# Patient Record
Sex: Female | Born: 1990 | Race: White | Hispanic: No | Marital: Married | Smoking: Former smoker
Health system: Southern US, Community
[De-identification: ages and names within clinical notes are randomized; demographics above are authoritative.]

## PROBLEM LIST (undated history)

## (undated) DIAGNOSIS — G43909 Migraine, unspecified, not intractable, without status migrainosus: Secondary | ICD-10-CM

## (undated) DIAGNOSIS — Z87898 Personal history of other specified conditions: Secondary | ICD-10-CM

## (undated) DIAGNOSIS — F1911 Other psychoactive substance abuse, in remission: Secondary | ICD-10-CM

## (undated) DIAGNOSIS — R55 Syncope and collapse: Secondary | ICD-10-CM

## (undated) DIAGNOSIS — F1011 Alcohol abuse, in remission: Secondary | ICD-10-CM

## (undated) DIAGNOSIS — B1920 Unspecified viral hepatitis C without hepatic coma: Secondary | ICD-10-CM

## (undated) HISTORY — DX: Syncope and collapse: R55

## (undated) HISTORY — PX: BREAST SURGERY: SHX581

## (undated) HISTORY — DX: Other psychoactive substance abuse, in remission: F19.11

## (undated) HISTORY — DX: Migraine, unspecified, not intractable, without status migrainosus: G43.909

## (undated) HISTORY — DX: Personal history of other specified conditions: Z87.898

## (undated) HISTORY — DX: Alcohol abuse, in remission: F10.11

## (undated) HISTORY — PX: TONSILLECTOMY: SUR1361

## (undated) HISTORY — PX: BREAST MASS EXCISION: SHX1267

---

## 2017-10-22 NOTE — L&D Delivery Note (Signed)
Delivery Note At 7:39 PM a viable female was delivered via Vaginal, Spontaneous (Presentation: OA to LOT ).  APGAR: 9, 9; weight  pending.   Placenta status: S/C/I ,Tomasa BlaseSchultz, for disposal in unit.  Cord: 3VC with the following complications: none.  Cord blood collected for typing  Anesthesia:  Epidural, local lido Episiotomy: None Lacerations:  2nd degree Suture Repair: 2.0 vicryl Est. Blood Loss (mL):  200  Mom to postpartum.  Baby "Kelsey Craig" to Couplet care / Skin to Skin.  Neta MendsDaniela C Paul, CNM 03/29/2018, 8:16 PM

## 2018-02-06 LAB — OB RESULTS CONSOLE RUBELLA ANTIBODY, IGM: Rubella: IMMUNE

## 2018-02-06 LAB — OB RESULTS CONSOLE HIV ANTIBODY (ROUTINE TESTING): HIV: NONREACTIVE

## 2018-02-06 LAB — OB RESULTS CONSOLE GC/CHLAMYDIA
CHLAMYDIA, DNA PROBE: NEGATIVE
GC PROBE AMP, GENITAL: NEGATIVE

## 2018-02-06 LAB — OB RESULTS CONSOLE VARICELLA ZOSTER ANTIBODY, IGG: Varicella: IMMUNE

## 2018-02-06 LAB — OB RESULTS CONSOLE ABO/RH: RH TYPE: POSITIVE

## 2018-02-06 LAB — OB RESULTS CONSOLE HEPATITIS B SURFACE ANTIGEN: Hepatitis B Surface Ag: NEGATIVE

## 2018-02-14 ENCOUNTER — Encounter (HOSPITAL_COMMUNITY): Payer: Self-pay

## 2018-02-14 ENCOUNTER — Ambulatory Visit (HOSPITAL_COMMUNITY): Payer: Self-pay

## 2018-02-18 ENCOUNTER — Inpatient Hospital Stay (HOSPITAL_COMMUNITY)
Admission: RE | Admit: 2018-02-18 | Discharge: 2018-02-18 | Disposition: A | Discharge status: Home / Self Care | Payer: Self-pay | Source: Ambulatory Visit | Attending: Obstetrics and Gynecology | Admitting: Obstetrics and Gynecology

## 2018-02-18 HISTORY — DX: Unspecified viral hepatitis C without hepatic coma: B19.20

## 2018-02-20 ENCOUNTER — Encounter (HOSPITAL_COMMUNITY): Payer: Self-pay

## 2018-02-20 ENCOUNTER — Ambulatory Visit (HOSPITAL_COMMUNITY)
Admission: RE | Admit: 2018-02-20 | Discharge: 2018-02-20 | Disposition: A | Discharge status: Home / Self Care | Payer: BLUE CROSS/BLUE SHIELD | Source: Ambulatory Visit | Attending: Obstetrics and Gynecology | Admitting: Obstetrics and Gynecology

## 2018-02-20 DIAGNOSIS — O98413 Viral hepatitis complicating pregnancy, third trimester: Secondary | ICD-10-CM | POA: Insufficient documentation

## 2018-02-20 DIAGNOSIS — Z3A35 35 weeks gestation of pregnancy: Secondary | ICD-10-CM | POA: Diagnosis not present

## 2018-02-20 DIAGNOSIS — B182 Chronic viral hepatitis C: Secondary | ICD-10-CM | POA: Insufficient documentation

## 2018-02-20 DIAGNOSIS — O98419 Viral hepatitis complicating pregnancy, unspecified trimester: Secondary | ICD-10-CM

## 2018-02-20 NOTE — Consult Note (Signed)
Maternal Fetal Medicine Consultation  Requesting Provider(s): [facility]  Primary OB: Sigmon Reason for consultation: Chronic Hepatitis C; delivery management recommendations  HPI: 27yo P0 at 35+3 weeks with chronic Hepatitis C, referred for delivery recommendations. 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 She recently saw the Hepatology clinic at [facility] for evaluation. At that time LFTs were normal, and she has been set up for repeat visit in 6 months to undergo treatment with antivirals. She does not know if she has had a recent viral load. She is reporting no problems with the pregnancy so far. OB History: OB History    Gravida  1   Para      Term      Preterm      AB      Living        SAB      TAB      Ectopic      Multiple      Live Births              PMH:  Past Medical History:  Diagnosis Date  . Hepatitis C     PSH:  Past Surgical History:  Procedure Laterality Date  . BREAST MASS EXCISION     Meds: PNV Allergies: Latex FH: See EPIC section Soc: See EPIC section  Review of Systems: no vaginal bleeding or cramping/contractions, no LOF, no nausea/vomiting. All other systems reviewed and are negative.  PE:  VS: BP 127/85  Pulse 102  Weight 204.2 lb GEN: well-appearing female ABD: gravid, NT  A/P: Chronic hepatitis C As far as delivery planning is concerned there is no need to interven prior to 40 weeks. Elective delivery after 39 weeks is acceptable. Factors associated with an increased risk of vertical Hepatitis C transmission include High viral load Prolonged ROM (>6 hours) Infection of mononuclear cells in the peripheral blood Invasive obstetrical procedures (episiotomy, fetal scalp electrodes). Factors that are NOT associated with higher rates of vertical transmission include Vaginal versus cesarean delivery Maternal IL28B genotype HCV genotype In short, delivery at term with  cesarean section for obstetrical indications only is the recommended interventions for patient's with chronic hepatitis C. Avoidance of SROM and prolonged ROM is recommended. Avoidance of any procedures that increase exposure of the baby to maternal blood are recommended Breastfeeding is not contraindicated unless the nipples are cracked or bleeding, or if the patient desires to begin antiviral treatment.  Thank you for the opportunity to be a part of the care of [facility]. Please contact our office if we can be of further assistance.   I spent approximately 30 minutes with this patient with over 50% of time spent in face-to-face counseling.

## 2018-03-03 LAB — OB RESULTS CONSOLE GBS: GBS: NEGATIVE

## 2018-03-29 ENCOUNTER — Encounter (HOSPITAL_COMMUNITY): Payer: Self-pay | Admitting: *Deleted

## 2018-03-29 ENCOUNTER — Inpatient Hospital Stay (HOSPITAL_COMMUNITY)
Admission: AD | Admit: 2018-03-29 | Discharge: 2018-03-31 | DRG: 806 | Disposition: A | Discharge status: Home / Self Care | Payer: BLUE CROSS/BLUE SHIELD | Attending: Obstetrics & Gynecology | Admitting: Obstetrics & Gynecology

## 2018-03-29 ENCOUNTER — Inpatient Hospital Stay (HOSPITAL_COMMUNITY): Payer: BLUE CROSS/BLUE SHIELD | Admitting: Anesthesiology

## 2018-03-29 ENCOUNTER — Encounter (HOSPITAL_COMMUNITY): Payer: Self-pay

## 2018-03-29 DIAGNOSIS — B182 Chronic viral hepatitis C: Secondary | ICD-10-CM | POA: Diagnosis present

## 2018-03-29 DIAGNOSIS — O26893 Other specified pregnancy related conditions, third trimester: Secondary | ICD-10-CM | POA: Diagnosis present

## 2018-03-29 DIAGNOSIS — R030 Elevated blood-pressure reading, without diagnosis of hypertension: Secondary | ICD-10-CM | POA: Diagnosis present

## 2018-03-29 DIAGNOSIS — Z3A40 40 weeks gestation of pregnancy: Secondary | ICD-10-CM | POA: Diagnosis not present

## 2018-03-29 DIAGNOSIS — O9081 Anemia of the puerperium: Secondary | ICD-10-CM | POA: Diagnosis present

## 2018-03-29 DIAGNOSIS — D62 Acute posthemorrhagic anemia: Secondary | ICD-10-CM | POA: Diagnosis present

## 2018-03-29 DIAGNOSIS — O9842 Viral hepatitis complicating childbirth: Secondary | ICD-10-CM | POA: Diagnosis present

## 2018-03-29 DIAGNOSIS — O169 Unspecified maternal hypertension, unspecified trimester: Secondary | ICD-10-CM | POA: Diagnosis present

## 2018-03-29 DIAGNOSIS — O98419 Viral hepatitis complicating pregnancy, unspecified trimester: Secondary | ICD-10-CM | POA: Diagnosis present

## 2018-03-29 DIAGNOSIS — Z3483 Encounter for supervision of other normal pregnancy, third trimester: Secondary | ICD-10-CM | POA: Diagnosis present

## 2018-03-29 LAB — CBC
HCT: 33.9 % — ABNORMAL LOW (ref 36.0–46.0)
HCT: 35.4 % — ABNORMAL LOW (ref 36.0–46.0)
Hemoglobin: 11.7 g/dL — ABNORMAL LOW (ref 12.0–15.0)
Hemoglobin: 12.1 g/dL (ref 12.0–15.0)
MCH: 29.8 pg (ref 26.0–34.0)
MCH: 29.7 pg (ref 26.0–34.0)
MCHC: 34.5 g/dL (ref 30.0–36.0)
MCHC: 34.2 g/dL (ref 30.0–36.0)
MCV: 86.3 fL (ref 78.0–100.0)
MCV: 86.8 fL (ref 78.0–100.0)
PLATELETS: 214 10*3/uL (ref 150–400)
Platelets: 231 10*3/uL (ref 150–400)
RBC: 3.93 MIL/uL (ref 3.87–5.11)
RBC: 4.08 MIL/uL (ref 3.87–5.11)
RDW: 14.1 % (ref 11.5–15.5)
RDW: 14.2 % (ref 11.5–15.5)
WBC: 15 10*3/uL — ABNORMAL HIGH (ref 4.0–10.5)
WBC: 16.9 10*3/uL — ABNORMAL HIGH (ref 4.0–10.5)

## 2018-03-29 LAB — COMPREHENSIVE METABOLIC PANEL
ALT: 24 U/L (ref 14–54)
ANION GAP: 11 (ref 5–15)
AST: 25 U/L (ref 15–41)
Albumin: 3 g/dL — ABNORMAL LOW (ref 3.5–5.0)
Alkaline Phosphatase: 179 U/L — ABNORMAL HIGH (ref 38–126)
BILIRUBIN TOTAL: 1.2 mg/dL (ref 0.3–1.2)
BUN: 7 mg/dL (ref 6–20)
CALCIUM: 9.1 mg/dL (ref 8.9–10.3)
CO2: 19 mmol/L — ABNORMAL LOW (ref 22–32)
Chloride: 106 mmol/L (ref 101–111)
Creatinine, Ser: 0.53 mg/dL (ref 0.44–1.00)
GFR calc Af Amer: >60 mL/min (ref >60)
Glucose, Bld: 84 mg/dL (ref 65–99)
POTASSIUM: 3.9 mmol/L (ref 3.5–5.1)
Sodium: 136 mmol/L (ref 135–145)
TOTAL PROTEIN: 6.7 g/dL (ref 6.5–8.1)

## 2018-03-29 LAB — TYPE AND SCREEN
ABO/RH(D): A POS
Antibody Screen: NEGATIVE

## 2018-03-29 LAB — ABO/RH: ABO/RH(D): A POS

## 2018-03-29 LAB — URIC ACID: Uric Acid, Serum: 4.4 mg/dL (ref 2.3–6.6)

## 2018-03-29 MED ORDER — LACTATED RINGERS IV BOLUS
1000.0000 mL | Freq: Once | INTRAVENOUS | Status: AC
  Administered 2018-03-29: 1000 mL via INTRAVENOUS

## 2018-03-29 MED ORDER — ONDANSETRON HCL 4 MG/2ML IJ SOLN: 4.0000 mg | INTRAMUSCULAR | Status: DC | PRN

## 2018-03-29 MED ORDER — DIBUCAINE 1 % RE OINT
1.0000 "application " | TOPICAL_OINTMENT | RECTAL | Status: DC | PRN
  Administered 2018-03-30: 1 via RECTAL
  Filled 2018-03-29: qty 28

## 2018-03-29 MED ORDER — FENTANYL 2.5 MCG/ML BUPIVACAINE 1/10 % EPIDURAL INFUSION (WH - ANES)
14.0000 mL/h | INTRAMUSCULAR | Status: DC | PRN
  Administered 2018-03-29: 14 mL/h via EPIDURAL
  Filled 2018-03-29: qty 100

## 2018-03-29 MED ORDER — SIMETHICONE 80 MG PO CHEW: 80.0000 mg | CHEWABLE_TABLET | ORAL | Status: DC | PRN

## 2018-03-29 MED ORDER — DIPHENHYDRAMINE HCL 25 MG PO CAPS: 25.0000 mg | ORAL_CAPSULE | Freq: Four times a day (QID) | ORAL | Status: DC | PRN

## 2018-03-29 MED ORDER — LIDOCAINE HCL (PF) 1 % IJ SOLN
30.0000 mL | INTRAMUSCULAR | Status: AC | PRN
  Administered 2018-03-29: 30 mL via SUBCUTANEOUS
  Filled 2018-03-29: qty 30

## 2018-03-29 MED ORDER — EPHEDRINE 5 MG/ML INJ
10.0000 mg | INTRAVENOUS | Status: DC | PRN
  Filled 2018-03-29: qty 2

## 2018-03-29 MED ORDER — IBUPROFEN 600 MG PO TABS
600.0000 mg | ORAL_TABLET | Freq: Four times a day (QID) | ORAL | Status: DC
  Administered 2018-03-29 – 2018-03-31 (×7): 600 mg via ORAL
  Filled 2018-03-29 (×7): qty 1

## 2018-03-29 MED ORDER — COCONUT OIL OIL: 1.0000 "application " | TOPICAL_OIL | Status: DC | PRN

## 2018-03-29 MED ORDER — ONDANSETRON HCL 4 MG PO TABS: 4.0000 mg | ORAL_TABLET | ORAL | Status: DC | PRN

## 2018-03-29 MED ORDER — LACTATED RINGERS IV SOLN
INTRAVENOUS | Status: DC
  Administered 2018-03-29: 16:00:00 via INTRAVENOUS

## 2018-03-29 MED ORDER — BENZOCAINE-MENTHOL 20-0.5 % EX AERO
1.0000 "application " | INHALATION_SPRAY | CUTANEOUS | Status: DC | PRN
  Filled 2018-03-29: qty 56

## 2018-03-29 MED ORDER — TERBUTALINE SULFATE 1 MG/ML IJ SOLN
0.2500 mg | Freq: Once | INTRAMUSCULAR | Status: DC | PRN
  Filled 2018-03-29: qty 1

## 2018-03-29 MED ORDER — LACTATED RINGERS IV SOLN: 500.0000 mL | INTRAVENOUS | Status: DC | PRN

## 2018-03-29 MED ORDER — ONDANSETRON HCL 4 MG/2ML IJ SOLN: 4.0000 mg | Freq: Four times a day (QID) | INTRAMUSCULAR | Status: DC | PRN

## 2018-03-29 MED ORDER — FLEET ENEMA 7-19 GM/118ML RE ENEM: 1.0000 | ENEMA | RECTAL | Status: DC | PRN

## 2018-03-29 MED ORDER — BISACODYL 10 MG RE SUPP: 10.0000 mg | Freq: Every day | RECTAL | Status: DC | PRN

## 2018-03-29 MED ORDER — ACETAMINOPHEN 325 MG PO TABS
650.0000 mg | ORAL_TABLET | ORAL | Status: DC | PRN
  Administered 2018-03-29: 650 mg via ORAL
  Filled 2018-03-29: qty 2

## 2018-03-29 MED ORDER — ACETAMINOPHEN 325 MG PO TABS
650.0000 mg | ORAL_TABLET | ORAL | Status: DC | PRN
  Administered 2018-03-31: 650 mg via ORAL
  Filled 2018-03-29: qty 2

## 2018-03-29 MED ORDER — OXYTOCIN 40 UNITS IN LACTATED RINGERS INFUSION - SIMPLE MED
2.5000 [IU]/h | INTRAVENOUS | Status: DC
  Administered 2018-03-29: 2.5 [IU]/h via INTRAVENOUS

## 2018-03-29 MED ORDER — OXYTOCIN 40 UNITS IN LACTATED RINGERS INFUSION - SIMPLE MED
1.0000 m[IU]/min | INTRAVENOUS | Status: DC
  Administered 2018-03-29: 2 m[IU]/min via INTRAVENOUS
  Filled 2018-03-29: qty 1000

## 2018-03-29 MED ORDER — OXYCODONE-ACETAMINOPHEN 5-325 MG PO TABS: 1.0000 | ORAL_TABLET | ORAL | Status: DC | PRN

## 2018-03-29 MED ORDER — OXYTOCIN BOLUS FROM INFUSION
500.0000 mL | Freq: Once | INTRAVENOUS | Status: AC
  Administered 2018-03-29: 500 mL via INTRAVENOUS

## 2018-03-29 MED ORDER — OXYCODONE HCL 5 MG PO TABS: 5.0000 mg | ORAL_TABLET | ORAL | Status: DC | PRN

## 2018-03-29 MED ORDER — DOCUSATE SODIUM 100 MG PO CAPS
100.0000 mg | ORAL_CAPSULE | Freq: Two times a day (BID) | ORAL | Status: DC
  Administered 2018-03-30 – 2018-03-31 (×3): 100 mg via ORAL
  Filled 2018-03-29 (×3): qty 1

## 2018-03-29 MED ORDER — TETANUS-DIPHTH-ACELL PERTUSSIS 5-2.5-18.5 LF-MCG/0.5 IM SUSP: 0.5000 mL | Freq: Once | INTRAMUSCULAR | Status: DC

## 2018-03-29 MED ORDER — OXYCODONE-ACETAMINOPHEN 5-325 MG PO TABS: 2.0000 | ORAL_TABLET | ORAL | Status: DC | PRN

## 2018-03-29 MED ORDER — SOD CITRATE-CITRIC ACID 500-334 MG/5ML PO SOLN: 30.0000 mL | ORAL | Status: DC | PRN

## 2018-03-29 MED ORDER — PROMETHAZINE HCL 25 MG/ML IJ SOLN
25.0000 mg | Freq: Once | INTRAMUSCULAR | Status: AC
  Administered 2018-03-29: 25 mg via INTRAVENOUS
  Filled 2018-03-29: qty 1

## 2018-03-29 MED ORDER — PRENATAL MULTIVITAMIN CH
1.0000 | ORAL_TABLET | Freq: Every day | ORAL | Status: DC
  Administered 2018-03-30 – 2018-03-31 (×2): 1 via ORAL
  Filled 2018-03-29 (×2): qty 1

## 2018-03-29 MED ORDER — FLEET ENEMA 7-19 GM/118ML RE ENEM: 1.0000 | ENEMA | Freq: Every day | RECTAL | Status: DC | PRN

## 2018-03-29 MED ORDER — PHENYLEPHRINE 40 MCG/ML (10ML) SYRINGE FOR IV PUSH (FOR BLOOD PRESSURE SUPPORT)
80.0000 ug | PREFILLED_SYRINGE | INTRAVENOUS | Status: DC | PRN
  Filled 2018-03-29: qty 5

## 2018-03-29 MED ORDER — NALBUPHINE HCL 10 MG/ML IJ SOLN: 10.0000 mg | Freq: Once | INTRAMUSCULAR | Status: DC | PRN

## 2018-03-29 MED ORDER — WITCH HAZEL-GLYCERIN EX PADS
1.0000 "application " | MEDICATED_PAD | CUTANEOUS | Status: DC | PRN
  Administered 2018-03-30: 1 via TOPICAL

## 2018-03-29 MED ORDER — BUTORPHANOL TARTRATE 1 MG/ML IJ SOLN
2.0000 mg | Freq: Once | INTRAMUSCULAR | Status: AC
  Administered 2018-03-29: 2 mg via INTRAVENOUS
  Filled 2018-03-29: qty 2

## 2018-03-29 MED ORDER — DIPHENHYDRAMINE HCL 50 MG/ML IJ SOLN: 12.5000 mg | INTRAMUSCULAR | Status: DC | PRN

## 2018-03-29 MED ORDER — PHENYLEPHRINE 40 MCG/ML (10ML) SYRINGE FOR IV PUSH (FOR BLOOD PRESSURE SUPPORT)
80.0000 ug | PREFILLED_SYRINGE | INTRAVENOUS | Status: DC | PRN
  Filled 2018-03-29: qty 5
  Filled 2018-03-29: qty 10

## 2018-03-29 MED ORDER — LACTATED RINGERS IV SOLN
500.0000 mL | Freq: Once | INTRAVENOUS | Status: AC
  Administered 2018-03-29: 500 mL via INTRAVENOUS

## 2018-03-29 MED ORDER — LIDOCAINE HCL (PF) 1 % IJ SOLN
INTRAMUSCULAR | Status: DC | PRN
  Administered 2018-03-29: 6 mL via EPIDURAL
  Administered 2018-03-29: 7 mL via EPIDURAL

## 2018-03-29 MED ORDER — FLUTICASONE PROPIONATE 50 MCG/ACT NA SUSP
1.0000 | Freq: Every day | NASAL | Status: DC | PRN
  Filled 2018-03-29: qty 16

## 2018-03-29 NOTE — Anesthesia Procedure Notes (Addendum)
Epidural Patient location during procedure: OB Start time: 03/29/2018 4:58 PM End time: 03/29/2018 5:02 PM  Staffing Anesthesiologist: Leilani AbleHatchett, Karianna Gusman, MD Performed: anesthesiologist   Preanesthetic Checklist Completed: patient identified, site marked, surgical consent, pre-op evaluation, timeout performed, IV checked, risks and benefits discussed and monitors and equipment checked  Epidural Patient position: sitting Prep: site prepped and draped and DuraPrep Patient monitoring: continuous pulse ox and blood pressure Approach: midline Location: L3-L4 Injection technique: LOR air  Needle:  Needle type: Tuohy  Needle gauge: 17 G Needle length: 9 cm and 9 Needle insertion depth: 6 cm Catheter type: closed end flexible Catheter size: 19 Gauge Catheter at skin depth: 11 cm Test dose: negative and Other  Assessment Sensory level: T9 Events: blood not aspirated, injection not painful, no injection resistance, negative IV test and paresthesia  Additional Notes R knee X 1Reason for block:procedure for pain

## 2018-03-29 NOTE — Progress Notes (Signed)
CNM gives pt options for POC at this time including going home, IV fluids and pain meds to rest or augmentation with pitocin, pt would like to try pain meds and rest at this time.

## 2018-03-29 NOTE — Progress Notes (Signed)
S: Comfortable post epidural.   O:  Vitals:   03/29/18 1452 03/29/18 1557  BP: 106/69 132/83  Pulse: 84 (!) 101  Resp: 16   Temp:     FHR 150, mod var, + accels, + early decels Ctx q 2-3 min, palp mod Dilation: 5 Effacement (%): 90 Station: 0 Presentation: Vertex Exam by:: D. Angeleah Labrake,CNM  Pitocin 2 miu/min  A/P: Active labor Pitocin augmentation FHR cat 1 Pain control - epidural  Anticipate NSVB.   Kelsey Mendsaniela C Lolamae Voisin, MSN, CNM 03/29/2018, 5:18 PM

## 2018-03-29 NOTE — Progress Notes (Signed)
Carlyon ShadowMary Elizabeth Bogusz is a 27 y.o. G1P0 at 1158w5d by LMP admitted for latent labor, Hep C chronic  Subjective:  Rested w/ Stadol for several hours, now awake and feeling ctx stronger. LOF noted with bathroom trip.   Objective: Vitals:   03/29/18 0827 03/29/18 0931 03/29/18 1255 03/29/18 1452  BP: 120/67 (!) 144/93 115/65 106/69  Pulse: 79 100 69 84  Resp: 16 16 16 16   Temp:   98.3 F (36.8 C)   TempSrc:   Oral   Weight:      Height:           FHT:  FHR: 140 bpm, variability: moderate,  accelerations:  Present,  decelerations:  Present early UC:   irregular, every 2-6  minutes SVE:   Dilation: 4 Effacement (%): 90 Station: 0 Exam by:: D.Paul,CNM  Labs:   Recent Labs    03/29/18 0245  WBC 15.0*  HGB 11.7*  HCT 33.9*  PLT 214   CMP     Component Value Date/Time   NA 136 03/29/2018 1007   K 3.9 03/29/2018 1007   CL 106 03/29/2018 1007   CO2 19 (L) 03/29/2018 1007   GLUCOSE 84 03/29/2018 1007   BUN 7 03/29/2018 1007   CREATININE 0.53 03/29/2018 1007   CALCIUM 9.1 03/29/2018 1007   PROT 6.7 03/29/2018 1007   ALBUMIN 3.0 (L) 03/29/2018 1007   AST 25 03/29/2018 1007   ALT 24 03/29/2018 1007   ALKPHOS 179 (H) 03/29/2018 1007   BILITOT 1.2 03/29/2018 1007   GFRNONAA >60 03/29/2018 1007   GFRAA >60 03/29/2018 1007   Assessment / Plan: Protracted latent phase  Labor: Start Pitocin augmentation Preeclampsia:  no signs or symptoms of toxicity and labs stable Fetal Wellbeing:  Category I Pain Control:  nitrous, aepidural, IV narcotics and labor support as needed I/D:  GBS neg, HCV viral load pending, avoid internal monitors, expedite delivery after SROM Anticipated MOD:  NSVD  Neta Mendsaniela C Paul, CNM, MSN 03/29/2018, 3:34 PM

## 2018-03-29 NOTE — MAU Note (Signed)
Membranes stripped on Thurs and 2cm at the time. Having strong ctxs for last 2 hours and bloody show.

## 2018-03-29 NOTE — Anesthesia Pain Management Evaluation Note (Signed)
  CRNA Pain Management Visit Note  Patient: Kelsey ShadowMary Elizabeth Brinker, 27 y.o., female  "Hello I am a member of the anesthesia team at [facility]. We have an anesthesia team available at all times to provide care throughout the hospital, including epidural management and anesthesia for C-section. I don't know your plan for the delivery whether it a natural birth, water birth, IV sedation, nitrous supplementation, doula or epidural, but we want to meet your pain goals."   1.Was your pain managed to your expectations on prior hospitalizations?   No prior hospitalizations  2.What is your expectation for pain management during this hospitalization?     Doula  3.How can we help you reach that goal? Patient has doula  Record the patient's initial score and the patient's pain goal.   Pain: 0  Pain Goal: 6 [facility] wants you to be able to say your pain was always managed very well.  Aniket Paye 03/29/2018

## 2018-03-29 NOTE — H&P (Signed)
OB ADMISSION/ HISTORY & PHYSICAL:  Admission Date: 03/29/2018  1:18 AM  Admit Diagnosis: Term pregnancy in latent labor.  Kelsey Craig is a 27 y.o. female presenting for painful ctx for past 2 days since membrane sweep done in office on 6/6. Reports ctx more intense since midnight and some bloody show noted. No LOF, + FM.  Has doula support and spouse Dorene SorrowJerry present and supportive.   Prenatal History: G1P0   EDC : 03/24/2018, by Last Menstrual Period  [facility] since [redacted] weeks gestation, [facility].  Prenatal course complicated by chronic Hep C (genotipe 3) s/p MFM and GI specialist consult Hx intravenous substance abuse and alcoholism, sober x 3 years  Prenatal Labs: ABO, Rh: A POS (04/18 0000)  Antibody: NEG (06/08 0245) Rubella: Immune (04/18 0000)  RPR:  non-reactive  HBsAg: Negative (04/18 0000)  HIV: Non-reactive (04/18 0000)  GBS: Negative (05/13 0000)  1 hr Glucola : 78 Genetics declined   Medical / Surgical History :  Past medical history:  Past Medical History:  Diagnosis Date  . Hepatitis C      Past surgical history:  Past Surgical History:  Procedure Laterality Date  . BREAST MASS EXCISION       Family History: History reviewed. No pertinent family history.   Social History:  reports that she has never smoked. She has never used smokeless tobacco. She reports that she drank alcohol. She reports that she has current or past drug history.   Allergies: Latex    Current Medications at time of admission:  Medications Prior to Admission  Medication Sig Dispense Refill Last Dose  . acetaminophen (TYLENOL) 325 MG tablet Take 650 mg by mouth every 6 (six) hours as needed for mild pain or headache.    Past Week at Unknown time  . fluticasone (FLONASE) 50 MCG/ACT nasal spray Place 1 spray into both nostrils daily as needed for allergies or rhinitis.   Past Week at Unknown time  . Prenatal Vit-Fe  Fumarate-FA (PRENATAL VITAMIN PO) Take 1 tablet by mouth daily.    03/28/2018 at Unknown time      Review of Systems: ROS  As noted above Denies HA,NV/RUQ pain/visual changes   Physical Exam:  Dilation: 3 Effacement (%): 90 Station: -1 Exam by:: D Angle Dirusso,CNM  Blood pressure 137/82, pulse 94, temperature 98.3 F (36.8 C), temperature source Oral, resp. rate 16, height 5\' 6"  (1.676 m), weight 104.8 kg (231 lb), last menstrual period 06/17/2017. BP range 137/82 - 141/90  General: AAO x 3, NAD, breathing w/ ctx, tired Heart: RRR Lungs:CTAB Abdomen:gravid, NT Extremities: no edema Genitalia / VE: no lesions, minimal show FHR: 140, mod var, + accels, no decels TOCO: q 3-4 min, palp, mild/mod  Labs:    Recent Labs    03/29/18 0245  WBC 15.0*  HGB 11.7*  HCT 33.9*  PLT 214       Assessment:  27 y.o. G1P0 at 4937w5d  1. Latent stage of labor 2. FHR category 1 3. GBS neg 4. Desires low intervention birth 5. Breastfeeding planned 6. Hep C carrier 7. Labile BP w/o neural s/sx  Plan:  1. Admit to BS 2. Routine L&D orders and PEC, HCV RNA load 3. Therapeutic rest - Stadol/Phenergan IV, LR 1L bolus 4. Expectant management for now, plan Pitocin augmentation if active physiologic labor not ensued during rest 5. Avoid AROM, internal monitoring 6. Anticipate NSVB   Dr Juliene PinaMody notified of  admission / plan of care   Neta Mends CNM, MSN 03/29/2018, 10:01 AM

## 2018-03-29 NOTE — Anesthesia Preprocedure Evaluation (Signed)
Anesthesia Evaluation  Patient identified by MRN, date of birth, ID band Patient awake    Reviewed: Allergy & Precautions, H&P , NPO status , Patient's Chart, lab work & pertinent test results  Airway Mallampati: II  TM Distance: >3 FB Neck ROM: full    Dental no notable dental hx. (+) Teeth Intact   Pulmonary neg pulmonary ROS,    Pulmonary exam normal breath sounds clear to auscultation       Cardiovascular negative cardio ROS Normal cardiovascular exam Rhythm:regular Rate:Normal     Neuro/Psych negative neurological ROS  negative psych ROS   GI/Hepatic negative GI ROS, (+) Hepatitis -, C  Endo/Other  negative endocrine ROS  Renal/GU negative Renal ROS  negative genitourinary   Musculoskeletal negative musculoskeletal ROS (+)   Abdominal (+) + obese,   Peds  Hematology negative hematology ROS (+)   Anesthesia Other Findings   Reproductive/Obstetrics (+) Pregnancy                             Anesthesia Physical Anesthesia Plan  ASA: II  Anesthesia Plan: Epidural   Post-op Pain Management:    Induction:   PONV Risk Score and Plan:   Airway Management Planned:   Additional Equipment:   Intra-op Plan:   Post-operative Plan:   Informed Consent: I have reviewed the patients History and Physical, chart, labs and discussed the procedure including the risks, benefits and alternatives for the proposed anesthesia with the patient or authorized representative who has indicated his/her understanding and acceptance.     Plan Discussed with:   Anesthesia Plan Comments:         Anesthesia Quick Evaluation

## 2018-03-30 ENCOUNTER — Other Ambulatory Visit: Payer: Self-pay

## 2018-03-30 LAB — CBC
HCT: 31 % — ABNORMAL LOW (ref 36.0–46.0)
Hemoglobin: 10.4 g/dL — ABNORMAL LOW (ref 12.0–15.0)
MCH: 29.9 pg (ref 26.0–34.0)
MCHC: 33.5 g/dL (ref 30.0–36.0)
MCV: 89.1 fL (ref 78.0–100.0)
Platelets: 225 10*3/uL (ref 150–400)
RBC: 3.48 MIL/uL — AB (ref 3.87–5.11)
RDW: 14.6 % (ref 11.5–15.5)
WBC: 15.5 10*3/uL — ABNORMAL HIGH (ref 4.0–10.5)

## 2018-03-30 LAB — HCV RNA QUANT
HCV QUANT LOG: 6.481 {Log_IU}/mL (ref >1.70)
HCV QUANT: 3030000 [IU]/mL (ref >50)

## 2018-03-30 LAB — RPR: RPR Ser Ql: NONREACTIVE

## 2018-03-30 NOTE — Progress Notes (Signed)
PPD # 1 SVD Information for the patient's newborn:  Kelsey Craig, Boy Kelsey Craig [[id number]][[id number]]  female      breast feeding  / Circumcision planned today Baby name: Kelsey Craig  S:  Reports feeling well, OOB to shower this AM.             Tolerating po/ No nausea or vomiting             Bleeding is light             Pain controlled with ibuprofen (OTC)             Up ad lib / ambulatory / voiding without difficulties        O:  A & O x 3, in no apparent distress              VS:  Vitals:   03/29/18 2201 03/29/18 2227 03/29/18 2340 03/30/18 0418  BP: 134/77 125/61 (!) 128/56 110/62  Pulse: 91 93 84 83  Resp: 18  18 17   Temp:  99.7 F (37.6 C) 98.9 F (37.2 C) 99.7 F (37.6 C)  TempSrc:  Oral Oral Oral  SpO2:   99%   Weight:      Height:        LABS:  Recent Labs    03/29/18 1626 03/30/18 0535  WBC 16.9* 15.5*  HGB 12.1 10.4*  HCT 35.4* 31.0*  PLT 231 225    Blood type: --/--/A POS, A POS Performed at [facility], [address] [place], [place] [address]  (06/08 0245)  Rubella: Immune (04/18 0000)   I&O: I/O last 3 completed shifts: In: -  Out: 350 [Urine:150; Blood:200]          No intake/output data recorded.  Lungs: Clear and unlabored  Heart: regular rate and rhythm / no murmurs  Abdomen: soft, non-tender, non-distended             Fundus: firm, non-tender, U-1  Perineum: repair intact  Lochia: moderate  Extremities: 1 edema, no calf pain or tenderness    A/P: PPD # 1 26 y.o., G1P1001   Principal Problem:   Second-degree perineal laceration, with delivery Active Problems:   Chronic hepatitis C affecting pregnancy, antepartum (HCC)   SVD 6/8   Elevated blood pressure affecting pregnancy, antepartum   Postpartum care following vaginal delivery  - BP stable PP, no s/sx PEC  - HCV viral load pending  - may breastfeed as long as not bleeding from nipples  - f/u w/ hepatologist at 6 month PP or when weaned off for initiating medical therapy   Doing well -  stable status  Routine post partum orders  Anticipate discharge tomorrow    Neta Mendsaniela C Benancio Osmundson, MSN, CNM 03/30/2018, 9:56 AM

## 2018-03-30 NOTE — Anesthesia Postprocedure Evaluation (Signed)
Anesthesia Post Note  Patient: Kelsey Craig  Procedure(s) Performed: AN AD HOC LABOR EPIDURAL     Patient location during evaluation: Mother Baby Anesthesia Type: Epidural Level of consciousness: awake and alert and oriented Pain management: satisfactory to patient Vital Signs Assessment: post-procedure vital signs reviewed and stable Respiratory status: spontaneous breathing and nonlabored ventilation Cardiovascular status: stable Postop Assessment: no headache, no backache, no signs of nausea or vomiting, adequate PO intake and patient able to bend at knees (patient up walking) Anesthetic complications: no    Last Vitals:  Vitals:   03/29/18 2340 03/30/18 0418  BP: (!) 128/56 110/62  Pulse: 84 83  Resp: 18 17  Temp: 37.2 C 37.6 C  SpO2: 99%     Last Pain:  Vitals:   03/30/18 0520  TempSrc:   PainSc: 0-No pain   Pain Goal: Patients Stated Pain Goal: 0 (03/29/18 2230)               Madison HickmanGREGORY,Aedon Deason

## 2018-03-30 NOTE — Lactation Note (Signed)
This note was copied from a baby's chart. Lactation Consultation Note  Patient Name: Kelsey Craig NWGNF'AToday's Date: 03/30/2018 Reason for consult: Initial assessment;Primapara;1st time breastfeeding;Term  P1 mother whose infant is now 615 hours old.  Mother concerned that baby has not fed since delivery.  I explained to parents that this is completely normal.  We reviewed feeding cues.  I encouraged STS while awake, feeding 8-12 times/24 hours or earlier if baby shows feeding cues and breast massage.  Explained normal newborn behavior.  Mother has large breasts with flat nipples.  Breast shells provided with instructions for use.  Mother stated that baby latched after delivery and she could feel a tug at her breast.  Offered to assist with a latch and mother accepted.  Positioned mother appropriately and assisted to latch baby onto the right breast in the football hold.  Baby did not open mouth and showed no interest in feeding at this time.  Reassured parents that this was normal.  He has a small emesis and reviewed how to handle spitting episodes and he also needed a diaper change.  After cleaning baby I showed parents how to swaddle and baby placed on back in bassinet.    Mom made aware of O/P services, breastfeeding support groups, community resources, and our phone # for post-discharge questions. Mother will call for assistance as needed and will begin wearing her breast shells later this morning.  Father present and supportive.   Maternal Data Formula Feeding for Exclusion: No Has patient been taught Hand Expression?: Yes Does the patient have breastfeeding experience prior to this delivery?: No  Feeding Feeding Type: Breast Fed Length of feed: 0 min  LATCH Score Latch: Too sleepy or reluctant, no latch achieved, no sucking elicited.  Audible Swallowing: None  Type of Nipple: Flat  Comfort (Breast/Nipple): Soft / non-tender  Hold (Positioning): Assistance needed to correctly position  infant at breast and maintain latch.  LATCH Score: 4  Interventions Interventions: Breast feeding basics reviewed;Assisted with latch;Skin to skin;Breast massage;Position options;Support pillows;Adjust position;Breast compression;Shells  Lactation Tools Discussed/Used     Consult Status Consult Status: Follow-up Date: 03/31/18 Follow-up type: In-patient    Kelsey Craig 03/30/2018, 1:12 AM

## 2018-03-31 MED ORDER — IBUPROFEN 600 MG PO TABS: 600.0000 mg | ORAL_TABLET | Freq: Four times a day (QID) | ORAL | 0 refills | Status: DC

## 2018-03-31 NOTE — Lactation Note (Signed)
This note was copied from a baby's chart. Lactation Consultation Note  Patient Name: Boy Elfredia NevinsMary Herren OZHYQ'MToday's Date: 03/31/2018 Reason for consult: Follow-up assessment   Baby 44 hours old.  Weight loss after circ and sleepiness yesterday.  Mother has small nipples and baby latches but at times but is concerned that he falls asleep quickly at the breast. Observed feeding w/ off and on latch.  Applied #16NS and baby sustained latch for more than 20 min.  Baby latched then on L breast without nipple shield and he sustained latch for more than 20 more min. A good amount of colostrum was in nipple shield after first breast. Overall infant's feedings have been longer than 30 min with intermittent swallows.  Set up DEBP with #21 flanges. Recommend when able mother post pump and give baby back volume pumped. FOB states mother is exhausted and really needs sleep. Mom encouraged to feed baby 8-12 times/24 hours and with feeding cues.  Suggest using nipple shield if needed.      Maternal Data Has patient been taught Hand Expression?: Yes Does the patient have breastfeeding experience prior to this delivery?: No  Feeding Feeding Type: Breast Fed Length of feed: 45 min  LATCH Score Latch: Grasps breast easily, tongue down, lips flanged, rhythmical sucking.  Audible Swallowing: A few with stimulation  Type of Nipple: Everted at rest and after stimulation  Comfort (Breast/Nipple): Soft / non-tender  Hold (Positioning): Assistance needed to correctly position infant at breast and maintain latch.  LATCH Score: 8  Interventions Interventions: DEBP;Breast feeding basics reviewed;Assisted with latch;Hand express;Breast compression;Adjust position;Support pillows;Position options  Lactation Tools Discussed/Used Tools: Nipple Shields Nipple shield size: 16   Consult Status Consult Status: Follow-up Date: 04/01/18 Follow-up type: In-patient    Dahlia ByesBerkelhammer, [facility] 03/31/2018, 4:26  PM

## 2018-03-31 NOTE — Progress Notes (Signed)
PPD #2. SVD with 2nd degree repair, [name]  S:  Reports feeling exhausted, but well overall              Tolerating po/ No nausea or vomiting / Denies dizziness or SOB             Bleeding is moderate             Pain controlled with Motrin and Tylenol             Up ad lib / ambulatory / voiding QS  Newborn breast feeding - baby had an x-ray today for bilious vomiting - normal results per FOB; pt. To see lactation prior to discharge / Circumcision - completed 6/9  O:               VS: BP 126/83 (BP Location: Right Arm)   Pulse 76   Temp 98.1 F (36.7 C) (Oral)   Resp 16   Ht '5\' 6"'  (1.676 m)   Wt 104.8 kg (231 lb)   LMP 06/17/2017   SpO2 99%   Breastfeeding? Unknown   BMI 37.28 kg/m    LABS:              Recent Labs    03/29/18 1626 03/30/18 0535  WBC 16.9* 15.5*  HGB 12.1 10.4*  PLT 231 225   Results for orders placed or performed during the hospital encounter of 03/29/18 (from the past 72 hour(s))  CBC     Status: Abnormal   Collection Time: 03/29/18  2:45 AM  Result Value Ref Range   WBC 15.0 (H) 4.0 - 10.5 K/uL   RBC 3.93 3.87 - 5.11 MIL/uL   Hemoglobin 11.7 (L) 12.0 - 15.0 g/dL   HCT 33.9 (L) 36.0 - 46.0 %   MCV 86.3 78.0 - 100.0 fL   MCH 29.8 26.0 - 34.0 pg   MCHC 34.5 30.0 - 36.0 g/dL   RDW 14.1 11.5 - 15.5 %   Platelets 214 150 - 400 K/uL    Comment: Performed at [facility], [address], [place], [place] [address]  RPR     Status: None   Collection Time: 03/29/18  2:45 AM  Result Value Ref Range   RPR Ser Ql Non Reactive Non Reactive    Comment: (NOTE) Performed At: [facility] [place], [place] [id number] Rush Farmer MD [id number] Performed at [facility], [address], [place], [place] [address]   Type and screen [place]     Status: None   Collection Time: 03/29/18  2:45 AM  Result Value Ref Range   ABO/RH(D) A POS    Antibody Screen NEG    Sample  Expiration      04/01/2018 Performed at [facility], [address], [place], [place] [address]   ABO/Rh     Status: None   Collection Time: 03/29/18  2:45 AM  Result Value Ref Range   ABO/RH(D)      A POS Performed at [facility], [address], [place], [place] [address]   HCV RNA quant     Status: None   Collection Time: 03/29/18 10:07 AM  Result Value Ref Range   HCV Quantitative 3,030,000 >50 IU/mL   HCV Quantitative Log 6.481 >1.70 log10 IU/mL   Test Information Comment     Comment: (NOTE) The quantitative range of this assay is 15 IU/mL to 100 million IU/mL. Performed At: [facility] [phone]  [place] [id number] Rush Farmer MD IO:[id number] Performed at [facility], [address], [place], [place] [address]   Comprehensive metabolic panel     Status: Abnormal   Collection Time: 03/29/18 10:07 AM  Result Value Ref Range   Sodium 136 135 - 145 mmol/L   Potassium 3.9 3.5 - 5.1 mmol/L   Chloride 106 101 - 111 mmol/L   CO2 19 (L) 22 - 32 mmol/L   Glucose, Bld 84 65 - 99 mg/dL   BUN 7 6 - 20 mg/dL   Creatinine, Ser 0.53 0.44 - 1.00 mg/dL   Calcium 9.1 8.9 - 10.3 mg/dL   Total Protein 6.7 6.5 - 8.1 g/dL   Albumin 3.0 (L) 3.5 - 5.0 g/dL   AST 25 15 - 41 U/L   ALT 24 14 - 54 U/L   Alkaline Phosphatase 179 (H) 38 - 126 U/L   Total Bilirubin 1.2 0.3 - 1.2 mg/dL   GFR calc non Af Amer >60 >60 mL/min   GFR calc Af Amer >60 >60 mL/min    Comment: (NOTE) The eGFR has been calculated using the CKD EPI equation. This calculation has not been validated in all clinical situations. eGFR's persistently <60 mL/min signify possible Chronic Kidney Disease.    Anion gap 11 5 - 15    Comment: Performed at [facility], [address], [place], [place] [address]  Uric acid     Status: None   Collection Time: 03/29/18 10:07 AM  Result Value Ref Range   Uric Acid, Serum 4.4 2.3 - 6.6 mg/dL    Comment: Performed at [facility], [address] [place], [place] [address]  CBC     Status: Abnormal   Collection Time: 03/29/18  4:26 PM  Result Value Ref Range   WBC 16.9 (H) 4.0 - 10.5 K/uL   RBC 4.08 3.87 - 5.11 MIL/uL   Hemoglobin 12.1 12.0 - 15.0 g/dL   HCT 35.4 (L) 36.0 - 46.0 %   MCV 86.8 78.0 - 100.0 fL   MCH 29.7 26.0 - 34.0 pg   MCHC 34.2 30.0 - 36.0 g/dL   RDW 14.2 11.5 - 15.5 %   Platelets 231 150 - 400 K/uL    Comment: Performed at [facility], [address], [place], [place] [address]  CBC     Status: Abnormal   Collection Time: 03/30/18  5:35 AM  Result Value Ref Range   WBC 15.5 (H) 4.0 - 10.5 K/uL   RBC 3.48 (L) 3.87 - 5.11 MIL/uL   Hemoglobin 10.4 (L) 12.0 - 15.0 g/dL   HCT 31.0 (L) 36.0 - 46.0 %   MCV 89.1 78.0 - 100.0 fL   MCH 29.9 26.0 - 34.0 pg   MCHC 33.5 30.0 - 36.0 g/dL   RDW 14.6 11.5 - 15.5 %   Platelets 225 150 - 400 K/uL    Comment: Performed at [facility], [address] [place], [place] [address]               Blood type: --/--/A POS, A POS Performed at [facility], [address], [place], [place] [address]  [id number]/08 0245)  Rubella: Immune (04/18 0000)                     I&O: Intake/Output      06/09 0701 - 06/10 0700 06/10 0701 - 06/11 0700   Urine (mL/kg/hr)     Blood     Total Output  Net                        Physical Exam:             Alert and oriented X3  Lungs: Clear and unlabored  Heart: regular rate and rhythm / no murmurs  Abdomen: soft, non-tender, non-distended              Fundus: firm, non-tender, U-2  Perineum: well approximated 2nd degree laceration, no edema, no ecchymosis, no erythema   Lochia: small, no clots   Extremities: trace BLE edema, no calf pain or tenderness    A/P: PPD # 2, SVD  2nd degree repair  Chronic Hepatitis C - viral load resulted   - F/u with hepatologist at 6 mo PP or when weaned from breastfeeding; has appt scheduled in October    - Okay to continue to breastfeeding as long as no bleeding from nipples    Mild ABL Anemia    - stable, asymptomatic   Elevated BP during labor   - delivered, BP stable, no s/s of PEC  Doing well - stable status  Routine post partum orders  Discharge home today   WOB discharge book, instructions, and warning s/s reviewed   F/u in 2 weeks, then at 6 weeks for PP visit    - Husband plans to go back to work after 1 week PP; he is gone during the week; they are planning to have a postpartum doula for extra support   Lars Pinks, MSN, CNM [facility]

## 2018-03-31 NOTE — Lactation Note (Signed)
This note was copied from a baby's chart. Lactation Consultation Note  Patient Name: Kelsey Craig: 03/31/2018 Reason for consult: Follow-up assessment   Observed end of feeding after 55 min. Baby has been able to latch successfully without nipple shield. Praised mother for efforts and patience.   Reviewed pump and finger syringe feeding.  Mother pumped and and after flanges had a good amount of colostrum with small amount in container. Suggest spoon feeding drops to baby when he wakes. Mother worried about weight loss last night and unsure and wants to supplement w/ formula.  FOB does not want to supplement. Reviewed LEAD and presented formula w/ volume guidelines. Suggest family breastfeed first before offering formula.   Mother will call nurse to help with finger syringe feeding if needed.    Maternal Data Has patient been taught Hand Expression?: Yes Does the patient have breastfeeding experience prior to this delivery?: No  Feeding Feeding Type: Breast Fed Length of feed: 55 min  LATCH Score Latch: Grasps breast easily, tongue down, lips flanged, rhythmical sucking.  Audible Swallowing: A few with stimulation  Type of Nipple: Everted at rest and after stimulation  Comfort (Breast/Nipple): Soft / non-tender  Hold (Positioning): Assistance needed to correctly position infant at breast and maintain latch.  LATCH Score: 8  Interventions Interventions: DEBP;Breast feeding basics reviewed;Assisted with latch;Hand express;Breast compression;Adjust position;Support pillows;Position options  Lactation Tools Discussed/Used Tools: Nipple Shields Nipple shield size: 16 Pump Review: Setup, frequency, and cleaning;Milk Storage Initiated by:: RB Craig initiated:: 03/31/18   Consult Status Consult Status: Follow-up Craig: 04/01/18 Follow-up type: In-patient    Dahlia ByesBerkelhammer, Unity [facility]pke [facility] 03/31/2018, 5:08 PM

## 2018-03-31 NOTE — Lactation Note (Signed)
This note was copied from a baby's chart. Lactation Consultation Note  Patient Name: Kelsey Craig HYQMV'HToday's Date: 03/31/2018   Baby 37 hours old.  Mother Hep C+ and is aware to pump and dump if nipples become cracked or bleeding and resume breastfeeding once healed. Provided mother w/ manual pump. Baby was circumcised yesterday.  She states she was fussy last night and cluster fed. Baby recently spit up large yellow/green emesis so will be having chest xray and MD requested waiting on latch. Had reviewed hand expression w/ mother and drops were expressed. Mother has small nipples so fitted her w/ #21 flanges for manual pump. LC will return later today to work w/ mother.      Maternal Data    Feeding Feeding Type: Breast Fed Length of feed: 35 min  LATCH Score                   Interventions    Lactation Tools Discussed/Used     Consult Status      Kelsey Craig, Kelsey Craig 03/31/2018, 9:16 AM

## 2018-03-31 NOTE — Discharge Summary (Signed)
Obstetric Discharge Summary   Patient Name: Kelsey Craig DOB: April 06, 1991 MRN: [id number]  Date of Admission: 03/29/2018 Date of Discharge: 03/31/2018 Date of Delivery: 03/29/18 Gestational Age at Delivery: 4413w5d  [facility] management/ M. Gearlene Godsil, CNM   Antepartum complications:  [facility] [redacted] weeks gestation, [facility]  Prenatal course complicated by chronic Hep C (genotipe 3) s/p MFM and GI specialist consult Hx intravenous substance abuse and alcoholism, sober x 3 years  Prenatal Labs:  ABO, Rh: A POS (04/18 0000)  Antibody: NEG (06/08 0245) Rubella: Immune (04/18 0000)  RPR:  non-reactive  HBsAg: Negative (04/18 0000)  HIV: Non-reactive (04/18 0000)  GBS: Negative (05/13 0000)  1 hr Glucola : 78 Genetics declined  Admitting Diagnosis: Labor at 40+5 weeks  Secondary Diagnoses: Patient Active Problem List   Diagnosis Date Noted  . SVD 6/8 03/29/2018  . Elevated blood pressure affecting pregnancy, antepartum 03/29/2018  . Postpartum care following vaginal delivery (6/8) 03/29/2018  . Second-degree perineal laceration, with delivery 03/29/2018  . Chronic hepatitis C affecting pregnancy, antepartum (HCC) 02/20/2018    Augmentation: Pitocin  Complications: None  Date of Delivery: 03/29/18 Delivered By: Arlan Organaniela Paul, CNM  Delivery Type: spontaneous vaginal delivery Anesthesia: epidural Placenta: sponatneous Laceration: 2nd degree lac Episiotomy: none  Newborn Data: Live born female  Birth Weight: 7 lb 7.9 oz (3400 g) APGAR: 9, 9  Newborn Delivery   Birth date/time:  03/29/2018 19:39:00 Delivery type:  Vaginal, Spontaneous     Hospital/Postpartum Course  (Vaginal Delivery): Pt. Admitted in early labor, slept with therapeutic rest, then progressed with SROM and Pitocin.  See notes for details.  Patient had an uncomplicated postpartum course.  By time of discharge on PPD#2,  her pain was controlled on oral pain medications; she had appropriate lochia and was ambulating, voiding without difficulty and tolerating regular diet.  She was deemed stable for discharge to home.     Labs: CBC Latest Ref Rng & Units 03/30/2018 03/29/2018 03/29/2018  WBC 4.0 - 10.5 K/uL 15.5(H) 16.9(H) 15.0(H)  Hemoglobin 12.0 - 15.0 g/dL 10.4(L) 12.1 11.7(L)  Hematocrit 36.0 - 46.0 % 31.0(L) 35.4(L) 33.9(L)  Platelets 150 - 400 K/uL 225 231 214   Conflict (See Lab Report): A POS/A POS Performed at [facility], [address] [place], [place] [address]   Physical exam:  BP 126/83 (BP Location: Right Arm)   Pulse 76   Temp 98.1 F (36.7 C) (Oral)   Resp 16   Ht 5\' 6"  (1.676 m)   Wt 104.8 kg (231 lb)   LMP 06/17/2017   SpO2 99%   Breastfeeding? Unknown   BMI 37.28 kg/m  General: alert and no distress Pulm: normal respiratory effort Lochia: appropriate Abdomen: soft, NT Uterine Fundus: firm, below umbilicus Perineum: healing well, no significant erythema, no significant edema Extremities: No evidence of DVT seen on physical exam. No lower extremity edema.   Disposition: stable, discharge to home Baby Feeding: breast milk Baby Disposition: home with mom  Contraception: unsure  Rh Immune globulin given:  N/A Rubella vaccine given: N/A Tdap vaccine given in AP or PP setting: declined Flu vaccine given in AP or PP setting: declined   Plan:  Kelsey Craig was discharged to home in good condition. [facility]  Discharge Instructions: Per After Visit Summary. Refer to After Visit Summary and [facility]  Activity: Advance as  tolerated. Pelvic rest for 6 weeks.   Diet: Regular, Heart Healthy Discharge Medications: Allergies as of 03/31/2018      Reactions   Latex Swelling      Medication List    TAKE these medications   fluticasone 50 MCG/ACT nasal spray Commonly known as:  FLONASE Place 1  spray into both nostrils daily as needed for allergies or rhinitis.   ibuprofen 600 MG tablet Commonly known as:  ADVIL,MOTRIN Take 1 tablet (600 mg total) by mouth every 6 (six) hours.   PRENATAL VITAMIN PO Take 1 tablet by mouth daily.   TYLENOL 325 MG tablet Generic drug:  acetaminophen Take 650 mg by mouth every 6 (six) hours as needed for mild pain or headache.            Discharge Care Instructions  (From admission, onward)        Start     Ordered   03/31/18 0000  Discharge wound care:    Comments:  Sitz baths 3-5 times per day   03/31/18 1225     Outpatient follow up:  Follow-up Information    Neta Mends, CNM. Schedule an appointment as soon as possible for a visit in 2 week(s).   Specialty:  Obstetrics and Gynecology Why:  Postpartum visit, then 6 weeks postpartum visit Contact information: [address] [place] [address] [phone]           Signed:  Carlean Jews, MSN, CNM [facility]

## 2018-04-01 ENCOUNTER — Ambulatory Visit: Payer: Self-pay

## 2018-04-01 NOTE — Lactation Note (Signed)
This note was copied from a baby's chart. Lactation Consultation Note  Patient Name: Kelsey Elfredia NevinsMary Craig ZOXWR'UToday's Date: 04/01/2018   Reason for consult: Follow-up assessment  6561 hours old female infant weight loss is stable now. Parents report mainly BF only offer little formula after feeding if infant still exhibit cues. Dad is supportive of breastfeeding and Mom is feeling more confident in her breastfeeding skills. Parents have their own  DEBP at home and will continue to use it. Mom  plans to breastfeed and pump her breast  after feedings. Mom plans to BF 8 to 12 times within 24 hours. Aware of stool transition  Patterns:   wet voids  and changes in stool patterns days 3-4 to green and days  5-6 yellow seedy and mustard in  color. Educated on ways to alleviate engorgement. Maternal Data    Feeding Feeding Type: Breast Fed Length of feed: 30 min  LATCH Score Latch: Grasps breast easily, tongue down, lips flanged, rhythmical sucking.  Audible Swallowing: A few with stimulation  Type of Nipple: Everted at rest and after stimulation  Comfort (Breast/Nipple): Soft / non-tender  Hold (Positioning): No assistance needed to correctly position infant at breast.  LATCH Score: 9  Interventions    Lactation Tools Discussed/Used     Consult Status Consult Status: Complete Date: 04/01/18    Danelle EarthlyRobin Carter Kassel 04/01/2018, 10:14 AM

## 2018-10-22 DIAGNOSIS — O139 Gestational [pregnancy-induced] hypertension without significant proteinuria, unspecified trimester: Secondary | ICD-10-CM

## 2018-10-22 HISTORY — DX: Gestational (pregnancy-induced) hypertension without significant proteinuria, unspecified trimester: O13.9

## 2018-10-22 NOTE — L&D Delivery Note (Signed)
Operative Delivery Note Pt. Admitted for IOL at 41+1 weeks for postdates, LGA, and new onset gestational hypertension. Room was prepared for shoulder dystocia and pt was counseled on risks prior to induction. At 3:04 PM a viable, healthy female "Elijah" was delivered via Vaginal, Spontaneous.  Presentation: vertex; Position: Left,, Occiput,, Anterior; As the head was delivering, turtle sign was noted.   Delivery of the head:   ,  3:03pm First maneuver: McRoberts and Suprapubic pressure Second maneuver: Attempted delivery of posterior arm, but unable to locate. Then baby restituted to direct OP, then back to LOA. Third maneuver: ,  Rubin maneuver (adduction of anterior shoulder) with suprapubic pressure relieved anterior shoulder, then the posterior shoulder and body followed.    Total time for shoulder dystocia: 65 seconds    APGAR: 8, 9; weight 8 lb 13.6 oz (4015 g).   Placenta status: delivered via Delena Bali, completely intact with a 3VC, Cord:  with the following complications: none.  Cord pH: 7.3. On nursing exam and my limited exam, upper extremities and clavicles appeared to be WNL. Facial bruising noted.   Anesthesia:  Epidural  Episiotomy: None Lacerations: 2nd degree Suture Repair: 2.0 3.0 vicryl Est. Blood Loss (mL): 200  Dr. Garwin Brothers in room immediately following delivery. NICU in attendance. Faculty practice was also notified for standby   Mom to postpartum.  Baby to Couplet care / Skin to Skin.  Tyler Deis Colandra Ohanian 09/29/2019, 4:02 PM

## 2019-02-17 LAB — OB RESULTS CONSOLE GC/CHLAMYDIA
Chlamydia: NEGATIVE
Gonorrhea: NEGATIVE

## 2019-02-17 LAB — OB RESULTS CONSOLE HIV ANTIBODY (ROUTINE TESTING): HIV: NONREACTIVE

## 2019-02-17 LAB — OB RESULTS CONSOLE HEPATITIS B SURFACE ANTIGEN: Hepatitis B Surface Ag: NEGATIVE

## 2019-02-17 LAB — OB RESULTS CONSOLE RUBELLA ANTIBODY, IGM: Rubella: IMMUNE

## 2019-02-17 LAB — OB RESULTS CONSOLE ABO/RH: RH Type: POSITIVE

## 2019-02-17 LAB — OB RESULTS CONSOLE ANTIBODY SCREEN: Antibody Screen: NEGATIVE

## 2019-02-17 LAB — OB RESULTS CONSOLE RPR: RPR: NONREACTIVE

## 2019-05-05 ENCOUNTER — Encounter (HOSPITAL_COMMUNITY): Payer: Self-pay | Admitting: *Deleted

## 2019-05-07 ENCOUNTER — Ambulatory Visit (HOSPITAL_COMMUNITY): Payer: Self-pay

## 2019-05-08 ENCOUNTER — Ambulatory Visit (HOSPITAL_COMMUNITY): Payer: BC Managed Care – PPO | Attending: Obstetrics and Gynecology | Admitting: Maternal & Fetal Medicine

## 2019-05-08 ENCOUNTER — Other Ambulatory Visit: Payer: Self-pay

## 2019-05-08 DIAGNOSIS — O98412 Viral hepatitis complicating pregnancy, second trimester: Secondary | ICD-10-CM | POA: Diagnosis not present

## 2019-05-08 DIAGNOSIS — Z3A20 20 weeks gestation of pregnancy: Secondary | ICD-10-CM

## 2019-05-08 DIAGNOSIS — O98419 Viral hepatitis complicating pregnancy, unspecified trimester: Secondary | ICD-10-CM

## 2019-05-08 DIAGNOSIS — B182 Chronic viral hepatitis C: Secondary | ICD-10-CM | POA: Diagnosis not present

## 2019-05-13 NOTE — Progress Notes (Signed)
MFM CONSULTATION TELEVISIT  Requesting provider: Karma Greaser Date of Service: 05/08/19 Reason for request: Hepatitis C in pregnancy.  Kelsey Craig is a 28 yo G2P1 at  [place] 4 d with an EDD of 11/23/20who is here at the request of Karma Greaser, CNM regarding evaluation and management of Hepatitis C in pregnancy.  I met with Kelsey Craig telephonically. I confirmed Kelsey Craig's identity via two identifiers.  She is overall doing well today without complaints she denies vaginal bleeding, loss of fluid or uterine contractions.   Her pregnancy history is uncomplicated. She had a prior delivery on 03/29/2018 female NSVD at 41 weeks 7lb 7 oz.   Secondly she has a known history of hepatitis C with a most recent CMP that demonstrated elevated LFT's AST 66 ALT 119. Hep C viral load  Is 27,200,000 OB panel was normal  She sees a hepatologist at [facility]- whom Kelsey Craig notes is aware of her viral load and pregnancy.  She had prior consultation with MFM on 02/20/2018 her LFT's were normal at that time.   Kelsey Craig denies taking antivirals as she has continued breast feeding.   Active Ambulatory Problems    Diagnosis Date Noted  . Chronic hepatitis C affecting pregnancy, antepartum ([place]) 02/20/2018  . SVD 6/8 03/29/2018  . Elevated blood pressure affecting pregnancy, antepartum 03/29/2018  . Postpartum care following vaginal delivery (6/8) 03/29/2018  . Second-degree perineal laceration, with delivery 03/29/2018   Resolved Ambulatory Problems    Diagnosis Date Noted  . No Resolved Ambulatory Problems   Past Medical History:  Diagnosis Date  . Hepatitis C   . History of alcohol abuse   . History of intravenous drug abuse [facility])    Past Surgical History:  Procedure Laterality Date  . BREAST MASS EXCISION    . BREAST SURGERY     Relationships  Social connections  . Talks on phone: Not on file  . Gets together: Not on file  . Attends religious service: Not on file  . Active member of club or  organization: Not on file  . Attends meetings of clubs or organizations: Not on file  . Relationship status: Not on file   Kelsey Craig notes a prior history of alcoholism and IV drug use.  Current Outpatient Medications on File Prior to Visit  Medication Sig Dispense Refill  . acetaminophen (TYLENOL) 325 MG tablet Take 650 mg by mouth every 6 (six) hours as needed for mild pain or headache.     . fluticasone (FLONASE) 50 MCG/ACT nasal spray Place 1 spray into both nostrils daily as needed for allergies or rhinitis.    Marland Kitchen ibuprofen (ADVIL,MOTRIN) 600 MG tablet Take 1 tablet (600 mg total) by mouth every 6 (six) hours. 30 tablet 0  . Prenatal Vit-Fe Fumarate-FA (PRENATAL VITAMIN PO) Take 1 tablet by mouth daily.      No current facility-administered medications on file prior to visit.    OB History  Gravida Para Term Preterm AB Living  '2 1 1     1  ' SAB TAB Ectopic Multiple Live Births        0 1    # Outcome Date GA Lbr Len/2nd Weight Sex Delivery Anes PTL Lv  2 Current           1 Term 03/29/18 46w5d19:30 / 00:09 7 lb 7.9 oz (3.4 kg) M Vag-Spont EPI  LIV   Impression/Counseling:  Hepatitis C:  We discussed the viremia in pregnancy poses a slight  increase of vertical transmission of ~5%. She does not have a co-infection which would increase the rate of vertical transmission.  I recommend avoiding prolonged rupture of membranes and fetal scalp electrodes during delivery these two events are independent risk factors for vertical transmission.   We would reserve cesarean delivery for obstetric indications.  Breastfeeding is safe.  Consider initiating treatment in the postpartum period after breastfeeding.   I spent 40 minutes with > 50% in non-face to face consultation.and care coordination.  All questions answered.  Vikki Ports, MD

## 2019-08-24 LAB — OB RESULTS CONSOLE GBS: GBS: NEGATIVE

## 2019-09-28 ENCOUNTER — Telehealth (HOSPITAL_COMMUNITY): Payer: Self-pay | Admitting: *Deleted

## 2019-09-28 ENCOUNTER — Other Ambulatory Visit: Payer: Self-pay | Admitting: Obstetrics and Gynecology

## 2019-09-28 ENCOUNTER — Other Ambulatory Visit: Payer: Self-pay

## 2019-09-28 DIAGNOSIS — Z3490 Encounter for supervision of normal pregnancy, unspecified, unspecified trimester: Secondary | ICD-10-CM | POA: Diagnosis present

## 2019-09-28 NOTE — Telephone Encounter (Signed)
Preadmission screen  

## 2019-09-28 NOTE — H&P (Addendum)
OB ADMISSION/ HISTORY & PHYSICAL:  Admission Date: 09/29/2019 12:02 AM  Admit Diagnosis: 41 weeks IOL for postdates  Kelsey Craig is a 28 y.o. female G2P1 at 76 weeks presenting for induction of labor for postdates and now new onset gestational hypertension.   Prenatal History: G2P1001   EDC : 09/21/2019, by Wyatt Mage  Prenatal care at [facility] Primary Ob Provider: Lars Pinks, CNM  Prenatal course complicated by  - Chronic Hepatitis C: followed by [facility] GI/Hepatology; Increased viral load and LFTs in 1st trimester, which stabilized; s/p MFM consult; plans treatment after breastfeeding - Depression in pregnancy: was started on Zoloft in 2nd trimester, but stopped after 3 weeks due to side effects - LGA - Proteinuria in office today, now with mild range BPs - normal PIH labs, new onset gestational hypertension   Prenatal Labs: ABO, Rh: A/Positive/-- (04/28 0000) Antibody: Negative (04/28 0000) Rubella: Immune (04/28 0000)  RPR: Nonreactive (04/28 0000)  HBsAg: Negative (04/28 0000)  HIV: Non-reactive (04/28 0000)  GTT: 69 GBS: Negative/-- (11/02 0000)  NT/Ultrascreen WNL Declined AFP Normal anatomy ultrasound, posterior placenta Growth sono: 12/7 EFW 9#6oz@ 97%, AFI 20cm  Medical / Surgical History :  Past medical history:  Past Medical History:  Diagnosis Date  . Hepatitis C   . History of alcohol abuse   . History of intravenous drug abuse [facility])      Past surgical history:  Past Surgical History:  Procedure Laterality Date  . BREAST MASS EXCISION    . BREAST SURGERY      Family History: No family history on file.   Social History:  reports that she has quit smoking. She has never used smokeless tobacco. She reports previous alcohol use. She reports previous drug use.   Allergies: Latex    Current Medications at time of admission:  Prior to Admission medications   Medication Sig Start Date End Date Taking? Authorizing Provider  fluticasone  (FLONASE) 50 MCG/ACT nasal spray Place 1 spray into both nostrils daily as needed for allergies or rhinitis.    [provider]  ibuprofen (ADVIL,MOTRIN) 600 MG tablet Take 1 tablet (600 mg total) by mouth every 6 (six) hours. 03/31/18   Ebelyn Bohnet, Tyler Deis, CNM  Prenatal Vit-Fe Fumarate-FA (PRENATAL VITAMIN PO) Take 1 tablet by mouth daily.     [provider]     Review of Systems: Active FM Irregular BH ctxs  No LOF  / SROM No bloody show    Physical Exam:  VS: Blood pressure (!) 119/52, pulse 74, temperature 98.1 F (36.7 C), temperature source Oral, resp. rate 17, height 5\' 6"  (1.676 m), weight 108.8 kg, last menstrual period 12/08/2018, unknown if currently breastfeeding. 09/29/19 0727  97.7 F (36.5 C)  96  -  18  140/92Abnormal   -  -  -  -  -  - AG   09/29/19 0602  -  74  -  -  119/52Abnormal   Left side lying/tilt  -  -  -  -  - KK   09/29/19 0501  98.1 F (36.7 C)  82  -  -  131/90  -  -  -  -  -  - KK   09/29/19 0401  -  80  -  -  129/81  -  -  -  -  -  - KK   09/29/19 0301  -  84  -  -  130/81  -  -  -  -  -  -  KK   09/29/19 0201  -  78  -  -  133/80  -  -  -  -  -  - KK   09/29/19 0140  -  86  -  -  130/83  -  -  -  -  -  - KK   09/29/19 0045  -  80  -  -  155/93Abnormal   -  -  -  -  -  - KK   09/29/19 0013  98.3 F (36.8 C)  92  -  17  147/101Abnormal   -  -  -  -  -  108.8 kg KK                              Results for orders placed or performed during the hospital encounter of 09/29/19 (from the past 24 hour(s))  SARS CORONAVIRUS 2 (TAT 6-24 HRS) Nasopharyngeal Nasopharyngeal Swab     Status: None   Collection Time: 09/29/19 12:05 AM   Specimen: Nasopharyngeal Swab  Result Value Ref Range   SARS Coronavirus 2 NEGATIVE NEGATIVE  CBC     Status: Abnormal   Collection Time: 09/29/19 12:29 AM  Result Value Ref Range   WBC 12.1 (H) 4.0 - 10.5 K/uL   RBC 4.18 3.87 - 5.11 MIL/uL   Hemoglobin 12.3 12.0 - 15.0 g/dL   HCT 78.2 42.3 - 53.6 %   MCV  87.3 80.0 - 100.0 fL   MCH 29.4 26.0 - 34.0 pg   MCHC 33.7 30.0 - 36.0 g/dL   RDW 14.4 31.5 - 40.0 %   Platelets 208 150 - 400 K/uL   nRBC 0.0 0.0 - 0.2 %  Comprehensive metabolic panel     Status: Abnormal   Collection Time: 09/29/19 12:29 AM  Result Value Ref Range   Sodium 136 135 - 145 mmol/L   Potassium 3.7 3.5 - 5.1 mmol/L   Chloride 105 98 - 111 mmol/L   CO2 20 (L) 22 - 32 mmol/L   Glucose, Bld 75 70 - 99 mg/dL   BUN 7 6 - 20 mg/dL   Creatinine, Ser 8.67 0.44 - 1.00 mg/dL   Calcium 61.9 8.9 - 50.9 mg/dL   Total Protein 6.2 (L) 6.5 - 8.1 g/dL   Albumin 2.7 (L) 3.5 - 5.0 g/dL   AST 23 15 - 41 U/L   ALT 22 0 - 44 U/L   Alkaline Phosphatase 148 (H) 38 - 126 U/L   Total Bilirubin 1.1 0.3 - 1.2 mg/dL   GFR calc non Af Amer >60 >60 mL/min   GFR calc Af Amer >60 >60 mL/min   Anion gap 11 5 - 15  Type and screen     Status: None   Collection Time: 09/29/19 12:29 AM  Result Value Ref Range   ABO/RH(D) A POS    Antibody Screen NEG    Sample Expiration      10/02/2019,2359 Performed at [facility], [address], [place], [place] [address]   Uric acid     Status: None   Collection Time: 09/29/19 12:29 AM  Result Value Ref Range   Uric Acid, Serum 4.7 2.5 - 7.1 mg/dL  ABO/Rh     Status: None (Preliminary result)   Collection Time: 09/29/19 12:29 AM  Result Value Ref Range   ABO/RH(D)      A POS Performed at [facility],  [address] [place], [place] [id number]   Protein / creatinine ratio, urine     Status: None   Collection Time: 09/29/19  1:33 AM  Result Value Ref Range   Creatinine, Urine 47.56 mg/dL   Total Protein, Urine 6 mg/dL   Protein Creatinine Ratio 0.13 0.00 - 0.15 mg/mg[Cre]   General: alert and oriented Heart: RRR Lungs: Clear lung fields Abdomen: Gravid, soft and non-tender, non-distended / uterus: gravid, non-tender Extremities: trace LE edema  Genitalia / VE: Dilation: 2.5 Effacement (%): 50 Station: -3 Exam by:: Marshia LyKatie Kirkman,  RN  FHR: baseline rate 135bpm/moderate variability/+accels 15x15/ no decels  TOCO: every 2-3 minutes/ mild   Assessment: [redacted] weeks gestation Induction stage of labor FHR category 1 GBS Negative Chronic Hepatitis C LGA New onset Gestational Hypertension without proteinuria   Plan:  [facility]   - Routine labor and delivery orders   - Pain management: planning epidural    - Cytotec 50mcg buccal every 4-6 hrs PRN - s/p 2 doses overnight    - PIH labs WNL  - PIH IV antihypertensive protocol PRN   - Avoid prolonged rupture of membranes and FSE due to Chronic Hep C 2. GBS Negative 3. Postpartum:   - Breast   - Planning circumcision PP 4. Anticipate MOD: NSVD   - Proven pelvis: 7#7oz   - Risk of shoulder dystocia and potential complications leading to c/s reviewed with LGA; pt. Verbalized understanding and agrees   Dr. Billy Coastaavon notified of admission / plan of care  Carlean JewsMeredith Maveryk Renstrom, MSN, [facility]

## 2019-09-29 ENCOUNTER — Inpatient Hospital Stay (HOSPITAL_COMMUNITY)
Admission: AD | Admit: 2019-09-29 | Discharge: 2019-09-30 | DRG: 806 | Disposition: A | Discharge status: Home / Self Care | Payer: BC Managed Care – PPO | Attending: Obstetrics and Gynecology | Admitting: Obstetrics and Gynecology

## 2019-09-29 ENCOUNTER — Inpatient Hospital Stay (HOSPITAL_COMMUNITY): Payer: BC Managed Care – PPO | Admitting: Anesthesiology

## 2019-09-29 ENCOUNTER — Encounter (HOSPITAL_COMMUNITY): Payer: Self-pay | Admitting: General Practice

## 2019-09-29 ENCOUNTER — Inpatient Hospital Stay (HOSPITAL_COMMUNITY): Payer: BC Managed Care – PPO

## 2019-09-29 DIAGNOSIS — O134 Gestational [pregnancy-induced] hypertension without significant proteinuria, complicating childbirth: Secondary | ICD-10-CM | POA: Diagnosis present

## 2019-09-29 DIAGNOSIS — O99344 Other mental disorders complicating childbirth: Secondary | ICD-10-CM | POA: Diagnosis present

## 2019-09-29 DIAGNOSIS — B182 Chronic viral hepatitis C: Secondary | ICD-10-CM | POA: Diagnosis present

## 2019-09-29 DIAGNOSIS — O139 Gestational [pregnancy-induced] hypertension without significant proteinuria, unspecified trimester: Secondary | ICD-10-CM | POA: Diagnosis present

## 2019-09-29 DIAGNOSIS — Z87891 Personal history of nicotine dependence: Secondary | ICD-10-CM | POA: Diagnosis not present

## 2019-09-29 DIAGNOSIS — F329 Major depressive disorder, single episode, unspecified: Secondary | ICD-10-CM | POA: Diagnosis present

## 2019-09-29 DIAGNOSIS — O99214 Obesity complicating childbirth: Secondary | ICD-10-CM | POA: Diagnosis present

## 2019-09-29 DIAGNOSIS — Z20828 Contact with and (suspected) exposure to other viral communicable diseases: Secondary | ICD-10-CM | POA: Diagnosis present

## 2019-09-29 DIAGNOSIS — O3663X0 Maternal care for excessive fetal growth, third trimester, not applicable or unspecified: Secondary | ICD-10-CM | POA: Diagnosis present

## 2019-09-29 DIAGNOSIS — Z3A41 41 weeks gestation of pregnancy: Secondary | ICD-10-CM

## 2019-09-29 DIAGNOSIS — O480 Post-term pregnancy: Secondary | ICD-10-CM | POA: Diagnosis present

## 2019-09-29 DIAGNOSIS — Z3490 Encounter for supervision of normal pregnancy, unspecified, unspecified trimester: Secondary | ICD-10-CM | POA: Diagnosis present

## 2019-09-29 DIAGNOSIS — O9842 Viral hepatitis complicating childbirth: Secondary | ICD-10-CM | POA: Diagnosis present

## 2019-09-29 LAB — CBC
HCT: 36.5 % (ref 36.0–46.0)
Hemoglobin: 12.3 g/dL (ref 12.0–15.0)
MCH: 29.4 pg (ref 26.0–34.0)
MCHC: 33.7 g/dL (ref 30.0–36.0)
MCV: 87.3 fL (ref 80.0–100.0)
Platelets: 208 10*3/uL (ref 150–400)
RBC: 4.18 MIL/uL (ref 3.87–5.11)
RDW: 14.9 % (ref 11.5–15.5)
WBC: 12.1 10*3/uL — ABNORMAL HIGH (ref 4.0–10.5)
nRBC: 0 % (ref 0.0–0.2)

## 2019-09-29 LAB — COMPREHENSIVE METABOLIC PANEL
ALT: 22 U/L (ref 0–44)
AST: 23 U/L (ref 15–41)
Albumin: 2.7 g/dL — ABNORMAL LOW (ref 3.5–5.0)
Alkaline Phosphatase: 148 U/L — ABNORMAL HIGH (ref 38–126)
Anion gap: 11 (ref 5–15)
BUN: 7 mg/dL (ref 6–20)
CO2: 20 mmol/L — ABNORMAL LOW (ref 22–32)
Calcium: 10 mg/dL (ref 8.9–10.3)
Chloride: 105 mmol/L (ref 98–111)
Creatinine, Ser: 0.62 mg/dL (ref 0.44–1.00)
GFR calc Af Amer: >60 mL/min (ref >60)
GFR calc non Af Amer: >60 mL/min (ref >60)
Glucose, Bld: 75 mg/dL (ref 70–99)
Potassium: 3.7 mmol/L (ref 3.5–5.1)
Sodium: 136 mmol/L (ref 135–145)
Total Bilirubin: 1.1 mg/dL (ref 0.3–1.2)
Total Protein: 6.2 g/dL — ABNORMAL LOW (ref 6.5–8.1)

## 2019-09-29 LAB — PROTEIN / CREATININE RATIO, URINE
Creatinine, Urine: 47.56 mg/dL
Protein Creatinine Ratio: 0.13 mg/mg{Cre} (ref 0.00–0.15)
Total Protein, Urine: 6 mg/dL

## 2019-09-29 LAB — SARS CORONAVIRUS 2 (TAT 6-24 HRS): SARS Coronavirus 2: NEGATIVE

## 2019-09-29 LAB — URIC ACID: Uric Acid, Serum: 4.7 mg/dL (ref 2.5–7.1)

## 2019-09-29 LAB — RPR: RPR Ser Ql: NONREACTIVE

## 2019-09-29 LAB — TYPE AND SCREEN
ABO/RH(D): A POS
Antibody Screen: NEGATIVE

## 2019-09-29 LAB — ABO/RH: ABO/RH(D): A POS

## 2019-09-29 MED ORDER — LACTATED RINGERS IV SOLN: 500.0000 mL | Freq: Once | INTRAVENOUS | Status: DC

## 2019-09-29 MED ORDER — ONDANSETRON HCL 4 MG PO TABS: 4.0000 mg | ORAL_TABLET | ORAL | Status: DC | PRN

## 2019-09-29 MED ORDER — FENTANYL-BUPIVACAINE-NACL 0.5-0.125-0.9 MG/250ML-% EP SOLN
12.0000 mL/h | EPIDURAL | Status: DC | PRN
  Filled 2019-09-29: qty 250

## 2019-09-29 MED ORDER — ONDANSETRON HCL 4 MG/2ML IJ SOLN
4.0000 mg | Freq: Four times a day (QID) | INTRAMUSCULAR | Status: DC | PRN
  Administered 2019-09-29: 4 mg via INTRAVENOUS
  Filled 2019-09-29: qty 2

## 2019-09-29 MED ORDER — TERBUTALINE SULFATE 1 MG/ML IJ SOLN: 0.2500 mg | Freq: Once | INTRAMUSCULAR | Status: DC | PRN

## 2019-09-29 MED ORDER — BENZOCAINE-MENTHOL 20-0.5 % EX AERO
1.0000 "application " | INHALATION_SPRAY | CUTANEOUS | Status: DC | PRN
  Filled 2019-09-29: qty 56

## 2019-09-29 MED ORDER — DIPHENHYDRAMINE HCL 25 MG PO CAPS: 25.0000 mg | ORAL_CAPSULE | Freq: Four times a day (QID) | ORAL | Status: DC | PRN

## 2019-09-29 MED ORDER — PHENYLEPHRINE 40 MCG/ML (10ML) SYRINGE FOR IV PUSH (FOR BLOOD PRESSURE SUPPORT): 80.0000 ug | PREFILLED_SYRINGE | INTRAVENOUS | Status: DC | PRN

## 2019-09-29 MED ORDER — EPHEDRINE 5 MG/ML INJ: 10.0000 mg | INTRAVENOUS | Status: DC | PRN

## 2019-09-29 MED ORDER — SENNOSIDES-DOCUSATE SODIUM 8.6-50 MG PO TABS
2.0000 | ORAL_TABLET | ORAL | Status: DC
  Administered 2019-09-30: 2 via ORAL
  Filled 2019-09-29: qty 2

## 2019-09-29 MED ORDER — ACETAMINOPHEN 325 MG PO TABS: 650.0000 mg | ORAL_TABLET | ORAL | Status: DC | PRN

## 2019-09-29 MED ORDER — OXYTOCIN BOLUS FROM INFUSION
500.0000 mL | Freq: Once | INTRAVENOUS | Status: AC
  Administered 2019-09-29: 500 mL via INTRAVENOUS

## 2019-09-29 MED ORDER — OXYTOCIN 40 UNITS IN NORMAL SALINE INFUSION - SIMPLE MED
2.5000 [IU]/h | INTRAVENOUS | Status: DC
  Filled 2019-09-29: qty 1000

## 2019-09-29 MED ORDER — MISOPROSTOL 50MCG HALF TABLET
50.0000 ug | ORAL_TABLET | ORAL | Status: DC | PRN
  Administered 2019-09-29 (×2): 50 ug via ORAL
  Filled 2019-09-29 (×2): qty 1

## 2019-09-29 MED ORDER — OXYTOCIN 40 UNITS IN NORMAL SALINE INFUSION - SIMPLE MED
1.0000 m[IU]/min | INTRAVENOUS | Status: DC
  Administered 2019-09-29: 2 m[IU]/min via INTRAVENOUS

## 2019-09-29 MED ORDER — HYDRALAZINE HCL 20 MG/ML IJ SOLN: 10.0000 mg | INTRAMUSCULAR | Status: DC | PRN

## 2019-09-29 MED ORDER — OXYCODONE HCL 5 MG PO TABS: 5.0000 mg | ORAL_TABLET | ORAL | Status: DC | PRN

## 2019-09-29 MED ORDER — LACTATED RINGERS IV SOLN
INTRAVENOUS | Status: DC
  Administered 2019-09-29 (×2): via INTRAVENOUS

## 2019-09-29 MED ORDER — DIPHENHYDRAMINE HCL 50 MG/ML IJ SOLN: 12.5000 mg | INTRAMUSCULAR | Status: DC | PRN

## 2019-09-29 MED ORDER — WITCH HAZEL-GLYCERIN EX PADS: 1.0000 "application " | MEDICATED_PAD | CUTANEOUS | Status: DC | PRN

## 2019-09-29 MED ORDER — LABETALOL HCL 5 MG/ML IV SOLN: 80.0000 mg | INTRAVENOUS | Status: DC | PRN

## 2019-09-29 MED ORDER — LABETALOL HCL 5 MG/ML IV SOLN: 40.0000 mg | INTRAVENOUS | Status: DC | PRN

## 2019-09-29 MED ORDER — IBUPROFEN 600 MG PO TABS
600.0000 mg | ORAL_TABLET | Freq: Four times a day (QID) | ORAL | Status: DC
  Administered 2019-09-29 – 2019-09-30 (×4): 600 mg via ORAL
  Filled 2019-09-29 (×4): qty 1

## 2019-09-29 MED ORDER — LIDOCAINE HCL (PF) 1 % IJ SOLN
30.0000 mL | INTRAMUSCULAR | Status: AC | PRN
  Administered 2019-09-29: 30 mL via SUBCUTANEOUS
  Filled 2019-09-29: qty 30

## 2019-09-29 MED ORDER — COCONUT OIL OIL: 1.0000 "application " | TOPICAL_OIL | Status: DC | PRN

## 2019-09-29 MED ORDER — OXYCODONE HCL 5 MG PO TABS: 10.0000 mg | ORAL_TABLET | ORAL | Status: DC | PRN

## 2019-09-29 MED ORDER — ZOLPIDEM TARTRATE 5 MG PO TABS: 5.0000 mg | ORAL_TABLET | Freq: Every evening | ORAL | Status: DC | PRN

## 2019-09-29 MED ORDER — PRENATAL MULTIVITAMIN CH
1.0000 | ORAL_TABLET | Freq: Every day | ORAL | Status: DC
  Administered 2019-09-30: 1 via ORAL
  Filled 2019-09-29: qty 1

## 2019-09-29 MED ORDER — SIMETHICONE 80 MG PO CHEW: 80.0000 mg | CHEWABLE_TABLET | ORAL | Status: DC | PRN

## 2019-09-29 MED ORDER — SOD CITRATE-CITRIC ACID 500-334 MG/5ML PO SOLN: 30.0000 mL | ORAL | Status: DC | PRN

## 2019-09-29 MED ORDER — LABETALOL HCL 5 MG/ML IV SOLN: 20.0000 mg | INTRAVENOUS | Status: DC | PRN

## 2019-09-29 MED ORDER — ONDANSETRON HCL 4 MG/2ML IJ SOLN: 4.0000 mg | INTRAMUSCULAR | Status: DC | PRN

## 2019-09-29 MED ORDER — DIBUCAINE (PERIANAL) 1 % EX OINT: 1.0000 "application " | TOPICAL_OINTMENT | CUTANEOUS | Status: DC | PRN

## 2019-09-29 MED ORDER — ACETAMINOPHEN 325 MG PO TABS
650.0000 mg | ORAL_TABLET | ORAL | Status: DC | PRN
  Administered 2019-09-29: 650 mg via ORAL
  Filled 2019-09-29: qty 2

## 2019-09-29 NOTE — Progress Notes (Signed)
S:  C/o SROM at 1030am, feeling some intermittent pressure and tightening, but overall comfortable with epidural.   O:  VS: Blood pressure (!) 147/95, pulse 98, temperature 97.8 F (36.6 C), temperature source Oral, resp. rate 18, height 5\' 6"  (1.676 m), weight 108.8 kg, last menstrual period 12/08/2018, SpO2 98 %, unknown if currently breastfeeding.        FHR : baseline 135 bpm / variability moderate / accelerations + / no decelerations        Toco: contractions every 2-4 minutes / moderate        Cervix : Dilation: 4 Effacement (%): 80 Station: -1 Presentation: Vertex Exam by:: Lars Pinks CNM        Membranes: SROM clear fluid with vernix at 10:30am  A: 41+[redacted]weeks gestation Latent labor FHR category1 GBS Negative Chronic Hepatitis C LGA New onset Gestational Hypertension without proteinuria- no evidence of PEC  P: Continue frequent position changes with peanut ball      Continue Pitocin augmentation     Anticipate NSVD    Lars Pinks, MSN, CNM [facility]

## 2019-09-29 NOTE — Anesthesia Procedure Notes (Signed)
Epidural Patient location during procedure: OB Start time: 09/29/2019 9:00 AM End time: 09/29/2019 9:05 AM  Staffing Anesthesiologist: Janeece Riggers, MD  Preanesthetic Checklist Completed: patient identified, site marked, surgical consent, pre-op evaluation, timeout performed, IV checked, risks and benefits discussed and monitors and equipment checked  Epidural Patient position: sitting Prep: site prepped and draped and DuraPrep Patient monitoring: continuous pulse ox and blood pressure Approach: midline Location: L3-L4 Injection technique: LOR air  Needle:  Needle type: Tuohy  Needle gauge: 17 G Needle length: 9 cm and 9 Needle insertion depth: 6 cm Catheter type: closed end flexible Catheter size: 19 Gauge Catheter at skin depth: 11 cm Test dose: negative  Assessment Events: blood not aspirated, injection not painful, no injection resistance, negative IV test and no paresthesia

## 2019-09-29 NOTE — Progress Notes (Signed)
S:  Contractions were feeling stronger and requested epidural; s/p epidural now.   O:  VS: Blood pressure 132/84, pulse 90, temperature 97.7 F (36.5 C), temperature source Oral, resp. rate 18, height 5\' 6"  (1.676 m), weight 108.8 kg, last menstrual period 12/08/2018, SpO2 96 %, unknown if currently breastfeeding.        FHR : baseline 130 bpm / variability moderate / accelerations + / no decelerations        Toco: contractions every 2-3 minutes / mild-moderate        Cervix : Dilation: 4 Effacement (%): 60, 70 Station: -3 Presentation: Vertex Exam by:: Ailene Ravel Sigmon CNM  Membrane sweep performed with patient consent         Membranes: BBOW  41+[redacted]weeks gestation Inductionstage of labor FHR category1 GBS Negative Chronic Hepatitis C LGA New onset Gestational Hypertension without proteinuria   P: Clear liquid diet now     Begin Pitocin 2 milliunits x 2 milliunits      Defer amniotomy     Reassess in 1-2 hours     Anticipate NSVD    Lars Pinks, MSN, CNM [facility]

## 2019-09-29 NOTE — Progress Notes (Signed)
S:  Sitting on birth ball. C/o mild headache - took Tylenol. Feeling some pelvic pressure and tightening; some mild pain in hips and back. Reports she is hungry and would like to eat breakfast. Desires epidural prior to starting Pitocin   O:  S/p Cytotec 79mcg buccally x 2 doses VS: Blood pressure (!) 140/92, pulse 96, temperature 97.7 F (36.5 C), temperature source Oral, resp. rate 18, height 5\' 6"  (1.676 m), weight 108.8 kg, last menstrual period 12/08/2018, unknown if currently breastfeeding.        FHR : baseline 130 bpm / variability moderate / accelerations +accels / no decelerations        Toco: contractions every 2-3 minutes / mild         Cervix : deferred        Membranes: intact  A: 41+[redacted] weeks gestation Induction stage of labor FHR category 1 GBS Negative Chronic Hepatitis C LGA New onset Gestational Hypertension without proteinuria   P: Light laboring diet; okay to shower     Plan for epidural prior to Pitocin      Defer amniotomy       Reassess at 9am for plan     Anticipate NSVD    Lars Pinks, MSN, CNM [facility]

## 2019-09-29 NOTE — Anesthesia Preprocedure Evaluation (Signed)
Anesthesia Evaluation  Patient identified by MRN, date of birth, ID band Patient awake    Reviewed: Allergy & Precautions, H&P , NPO status , Patient's Chart, lab work & pertinent test results, reviewed documented beta blocker date and time   Airway Mallampati: II  TM Distance: >3 FB Neck ROM: full    Dental no notable dental hx. (+) Teeth Intact, Dental Advisory Given   Pulmonary neg pulmonary ROS, former smoker,    Pulmonary exam normal breath sounds clear to auscultation       Cardiovascular hypertension, Normal cardiovascular exam Rhythm:regular Rate:Normal     Neuro/Psych negative neurological ROS  negative psych ROS   GI/Hepatic negative GI ROS, (+) Hepatitis -, C  Endo/Other  Morbid obesity  Renal/GU negative Renal ROS  negative genitourinary   Musculoskeletal   Abdominal   Peds  Hematology negative hematology ROS (+)   Anesthesia Other Findings   Reproductive/Obstetrics (+) Pregnancy                             Anesthesia Physical Anesthesia Plan  ASA: III  Anesthesia Plan: Epidural   Post-op Pain Management:    Induction:   PONV Risk Score and Plan:   Airway Management Planned:   Additional Equipment:   Intra-op Plan:   Post-operative Plan:   Informed Consent: I have reviewed the patients History and Physical, chart, labs and discussed the procedure including the risks, benefits and alternatives for the proposed anesthesia with the patient or authorized representative who has indicated his/her understanding and acceptance.     Dental Advisory Given  Plan Discussed with: Anesthesiologist and CRNA  Anesthesia Plan Comments: (Labs checked- platelets confirmed with RN in room. Fetal heart tracing, per RN, reported to be stable enough for sitting procedure. Discussed epidural, and patient consents to the procedure:  included risk of possible headache,backache, failed  block, allergic reaction, and nerve injury. This patient was asked if she had any questions or concerns before the procedure started.)        Anesthesia Quick Evaluation

## 2019-09-29 NOTE — Anesthesia Postprocedure Evaluation (Signed)
Anesthesia Post Note  Patient: Kelsey Craig  Procedure(s) Performed: AN AD HOC LABOR EPIDURAL     Patient location during evaluation: Mother Baby Anesthesia Type: Epidural Level of consciousness: awake and alert Pain management: pain level controlled Vital Signs Assessment: post-procedure vital signs reviewed and stable Respiratory status: spontaneous breathing, nonlabored ventilation and respiratory function stable Cardiovascular status: stable Postop Assessment: no headache, no backache and epidural receding Anesthetic complications: no    Last Vitals:  Vitals:   09/29/19 1600 09/29/19 1617  BP: 128/69 140/76  Pulse: 80 82  Resp: 18 18  Temp:    SpO2:      Last Pain:  Vitals:   09/29/19 1515  TempSrc:   PainSc: 0-No pain   Pain Goal:                   Marbin Olshefski

## 2019-09-29 NOTE — Progress Notes (Signed)
Late entry note:  S:  Pt. C/o intense rectal pressure  O:  VS: Blood pressure 140/76, pulse 82, temperature 97.6 F (36.4 C), temperature source Oral, resp. rate 18, height 5\' 6"  (1.676 m), weight 108.8 kg, last menstrual period 12/08/2018, SpO2 98 %, unknown if currently breastfeeding.        FHR : baseline 130 bpm / variability moderate / accelerations +15x15 / early and occasional variable decelerations        Toco: contractions every 2-3 minutes/ strong        Cervix : Dilation: 10 Dilation Complete Date: 09/29/19 Dilation Complete Time: 1445 Effacement (%): 100 Station: Plus 2 Presentation: Vertex Exam by:: Lars Pinks CNM        Membranes: SROM - clear fluid   A: 41+[redacted]weeks gestation Latent labor FHR category1 GBS Negative Chronic Hepatitis C LGA New onset Gestational Hypertension without proteinuria- no evidence of PEC  P: Begin pushing     Lars Pinks, MSN, CNM [facility]

## 2019-09-30 LAB — COMPREHENSIVE METABOLIC PANEL
ALT: 22 U/L (ref 0–44)
AST: 34 U/L (ref 15–41)
Albumin: 2.5 g/dL — ABNORMAL LOW (ref 3.5–5.0)
Alkaline Phosphatase: 130 U/L — ABNORMAL HIGH (ref 38–126)
Anion gap: 11 (ref 5–15)
BUN: 5 mg/dL — ABNORMAL LOW (ref 6–20)
CO2: 22 mmol/L (ref 22–32)
Calcium: 9.3 mg/dL (ref 8.9–10.3)
Chloride: 104 mmol/L (ref 98–111)
Creatinine, Ser: 0.65 mg/dL (ref 0.44–1.00)
GFR calc Af Amer: >60 mL/min (ref >60)
GFR calc non Af Amer: >60 mL/min (ref >60)
Glucose, Bld: 114 mg/dL — ABNORMAL HIGH (ref 70–99)
Potassium: 3.8 mmol/L (ref 3.5–5.1)
Sodium: 137 mmol/L (ref 135–145)
Total Bilirubin: 0.9 mg/dL (ref 0.3–1.2)
Total Protein: 5.7 g/dL — ABNORMAL LOW (ref 6.5–8.1)

## 2019-09-30 LAB — CBC
HCT: 35.9 % — ABNORMAL LOW (ref 36.0–46.0)
Hemoglobin: 11.8 g/dL — ABNORMAL LOW (ref 12.0–15.0)
MCH: 29.3 pg (ref 26.0–34.0)
MCHC: 32.9 g/dL (ref 30.0–36.0)
MCV: 89.1 fL (ref 80.0–100.0)
Platelets: 188 10*3/uL (ref 150–400)
RBC: 4.03 MIL/uL (ref 3.87–5.11)
RDW: 15.1 % (ref 11.5–15.5)
WBC: 11.1 10*3/uL — ABNORMAL HIGH (ref 4.0–10.5)
nRBC: 0 % (ref 0.0–0.2)

## 2019-09-30 MED ORDER — ACETAMINOPHEN 500 MG PO TABS: 1000.0000 mg | ORAL_TABLET | Freq: Four times a day (QID) | ORAL | 2 refills | Status: AC | PRN

## 2019-09-30 MED ORDER — BENZOCAINE-MENTHOL 20-0.5 % EX AERO: 1.0000 "application " | INHALATION_SPRAY | CUTANEOUS | Status: DC | PRN

## 2019-09-30 MED ORDER — NIFEDIPINE ER OSMOTIC RELEASE 30 MG PO TB24
30.0000 mg | ORAL_TABLET | Freq: Every day | ORAL | Status: DC
  Administered 2019-09-30: 10:00:00 30 mg via ORAL
  Filled 2019-09-30: qty 1

## 2019-09-30 MED ORDER — IBUPROFEN 600 MG PO TABS: 600.0000 mg | ORAL_TABLET | Freq: Four times a day (QID) | ORAL | 0 refills | Status: DC

## 2019-09-30 MED ORDER — COCONUT OIL OIL: 1.0000 "application " | TOPICAL_OIL | 0 refills | Status: DC | PRN

## 2019-09-30 MED ORDER — NIFEDIPINE ER 30 MG PO TB24: 30.0000 mg | ORAL_TABLET | Freq: Every day | ORAL | 1 refills | Status: DC

## 2019-09-30 NOTE — Addendum Note (Signed)
Addendum  created 09/30/19 0809 by Ignacia Bayley, CRNA   Clinical Note Signed

## 2019-09-30 NOTE — Progress Notes (Signed)
Patient told about baby safe sleep. Call bell. Fuzzy blanket taken off and safe sleep taught. Fall precautions taught. Patient denies any PIH symptoms states she feels fine.

## 2019-09-30 NOTE — Progress Notes (Signed)
CSW received consult for history of anxiety and depression.  CSW met with MOB to offer support and complete assessment.    MOB sitting up in bed breastfeeding infant with FOB present at bedside on the phone, when CSW entered the room. FOB politely excused himself to continue his phone conversation in the hallway. CSW introduced self and received verbal permission from MOB to complete assessment with FOB present should he return. MOB attentive and engaged throughout assessment and was appropriate and tending to infant cues during visit. CSW inquired about MOB's mental health history and MOB acknowledged anxiety and depression throughout her pregnancy. MOB denied any mental health history prior to pregnancy and attributed much of her symptoms to having a 18.69-year-old at home and worrying about bringing an infant into that. CSW addressed Zoloft prescription during pregnancy and MOB confirmed she only took it for about two weeks but did not like the side effects. MOB stated things stabled out and she's currently feeling "good". MOB denied any interest in being started on a different medication or participating in counseling. MOB reported having good support from her mother who will be here until [facility] feels comfortable and FOB. MOB denied any previous PMADs but was receptive to education. CSW provided education regarding the baby blues period vs. perinatal mood disorders, discussed treatment and gave resources for mental health follow up if concerns arise.  CSW recommends self-evaluation during the postpartum time period using the New Mom Checklist from Postpartum Progress and encouraged MOB to contact a medical professional if symptoms are noted at any time. MOB did not appear to be displaying any acute mental health symptoms and denied any current SI, HI or DV.   MOB confirmed having all essential items for infant once discharged and reported infant would be sleeping in a crib once home. CSW provided review of  Sudden Infant Death Syndrome (SIDS) precautions and safe sleeping habits.    CSW identifies no further need for intervention and no barriers to discharge at this time.  Kelsey Craig, [place]  [facility] [employer] [phone]

## 2019-09-30 NOTE — Discharge Summary (Signed)
OB Discharge Summary  Patient Name: Kelsey Craig DOB: February 14, 1991 MRN: [id number]  Date of admission: 09/29/2019 Delivering provider: Lars Pinks C   Date of discharge: 09/30/2019  Admitting diagnosis: Pregnancy Intrauterine pregnancy: [redacted]w[redacted]d     Secondary diagnosis:Principal Problem:   Postpartum care following vaginal delivery (12/8) Active Problems:   SVD    Second-degree perineal laceration, with delivery   Encounter for planned induction of labor   Gestational hypertension  Additional problems:Shoulder dystocia      Discharge diagnosis:  Patient Active Problem List   Diagnosis Date Noted  . Gestational hypertension 09/29/2019  . Encounter for planned induction of labor 09/28/2019  . SVD  03/29/2018  . Postpartum care following vaginal delivery (12/8) 03/29/2018  . Second-degree perineal laceration, with delivery 03/29/2018  . Chronic hepatitis C affecting pregnancy, antepartum ([place]) 02/20/2018                                                                Post partum procedures:none  Augmentation: Pitocin and Cytotec Pain control: Epidural  Laceration:2nd degree  Episiotomy:None  Complications: None   Hospital course:  Induction of Labor With Vaginal Delivery   28 y.o. yo [id number] at [redacted]w[redacted]d was admitted to the hospital 09/29/2019 for induction of labor.  Indication for induction: Gestational hypertension and late term, suspected LGA.  Patient had an uncomplicated labor course as follows: Membrane Rupture Time/Date: 10:30 AM ,09/29/2019   Intrapartum Procedures: Episiotomy: None [1]                                         Lacerations:  2nd degree [3]  Patient had delivery of a Viable infant.  Information for the patient's newborn:  Spirit, Wernli [[id number]]  Delivery Method: Vaginal, Spontaneous(Filed from Delivery Summary)    09/29/2019  Details of delivery can be found in separate delivery note.  Patient had a routine postpartum course, blood pressure  labile to mild range for which she was started on Procardia 30xl daily, labs remained stable, and no neural preeclampsia symptoms. Patient desires discharge home on postpartum day 1. Patient is discharged home 09/30/19 with close f/u in office 1 week, preeclampsia precautions reviewed with patient and spouse.  Physical exam  Vitals:   09/29/19 2200 09/30/19 0200 09/30/19 0559 09/30/19 0900  BP: 136/64 (!) 146/86 (!) 141/92 124/84  Pulse: 68 65 87   Resp: 18 19 18    Temp: 97.6 F (36.4 C) 98 F (36.7 C) 98.2 F (36.8 C)   TempSrc: Oral Oral Oral   SpO2: 100% 100% 100%   Weight:      Height:       General: alert, cooperative and no distress Lochia: appropriate Uterine Fundus: firm Incision: Healing well with no significant drainage DVT Evaluation: No cords or calf tenderness. No significant calf/ankle edema. Labs: Lab Results  Component Value Date   WBC 11.1 (H) 09/30/2019   HGB 11.8 (L) 09/30/2019   HCT 35.9 (L) 09/30/2019   MCV 89.1 09/30/2019   PLT 188 09/30/2019   CMP Latest Ref Rng & Units 09/30/2019  Glucose 70 - 99 mg/dL 114(H)  BUN 6 - 20 mg/dL 5(L)  Creatinine 0.44 - 1.00  mg/dL 6.83  Sodium 419 - 622 mmol/L 137  Potassium 3.5 - 5.1 mmol/L 3.8  Chloride 98 - 111 mmol/L 104  CO2 22 - 32 mmol/L 22  Calcium 8.9 - 10.3 mg/dL 9.3  Total Protein 6.5 - 8.1 g/dL 2.9(N)  Total Bilirubin 0.3 - 1.2 mg/dL 0.9  Alkaline Phos 38 - 126 U/L 130(H)  AST 15 - 41 U/L 34  ALT 0 - 44 U/L 22      Discharge instruction: per After Visit Summary and "Baby and Me Booklet".  After Visit Meds:  Allergies as of 09/30/2019      Reactions   Latex Swelling      Medication List    TAKE these medications   acetaminophen 500 MG tablet Commonly known as: TYLENOL Take 2 tablets (1,000 mg total) by mouth every 6 (six) hours as needed.   benzocaine-Menthol 20-0.5 % Aero Commonly known as: DERMOPLAST Apply 1 application topically as needed for irritation (perineal discomfort).    coconut oil Oil Apply 1 application topically as needed.   fluticasone 50 MCG/ACT nasal spray Commonly known as: FLONASE Place 1 spray into both nostrils daily as needed for allergies or rhinitis.   ibuprofen 600 MG tablet Commonly known as: ADVIL Take 1 tablet (600 mg total) by mouth every 6 (six) hours.   NIFEdipine 30 MG 24 hr tablet Commonly known as: ADALAT CC Take 1 tablet (30 mg total) by mouth daily. Start taking on: October 01, 2019   PRENATAL VITAMIN PO Take 1 tablet by mouth daily.            Discharge Care Instructions  (From admission, onward)         Start     Ordered   09/30/19 0000  Discharge wound care:    Comments: Sitz baths 2 times /day with warm water x 1 week. May add herbals: 1 ounce dried comfrey leaf* 1 ounce calendula flowers 1 ounce lavender flowers 1/2 ounce dried uva ursi leaves 1/2 ounce witch hazel blossoms (if you can find them) 1/2 ounce dried sage leaf 1/2 cup sea salt Directions: Bring 2 quarts of water to a boil. Turn off heat, and place 1 ounce (approximately 1 large handful) of the above mixed herbs (not the salt) into the pot. Steep, covered, for 30 minutes.  Strain the liquid well with a fine mesh strainer, and discard the herb material. Add 2 quarts of liquid to the tub, along with the 1/2 cup of salt. This medicinal liquid can also be made into compresses and peri-rinses.   09/30/19 1107          Diet: routine diet  Activity: Advance as tolerated. Pelvic rest for 6 weeks.   Postpartum contraception: Not Discussed  Newborn Data: Live born female  Birth Weight: 8 lb 13.6 oz (4015 g) APGAR: 8, 9  Newborn Delivery   Birth date/time: 09/29/2019 15:04:00 Delivery type: Vaginal, Spontaneous      named Elijah Baby Feeding: Breast Disposition:home with mother   Delivery Report:  Review the Delivery Report for details.    Follow up: Follow-up Information    Karena Addison, CNM. Schedule an appointment as  soon as possible for a visit in 1 week(s).   Specialty: Obstetrics and Gynecology Why: BP check Contact information: [address] [place] [address] [phone]             Signed: Cipriano Mile, MSN 09/30/2019, 11:09 AM

## 2019-09-30 NOTE — Lactation Note (Signed)
This note was copied from a baby's chart. Lactation Consultation Note  Patient Name: Kelsey Craig Date: 09/30/2019 Reason for consult: Initial assessment;Term P2.  Mom breastfed her first baby for a little over one year.  Newborn is 65 hours old.  Mom reports that baby is latching easily.  She has no questions or concerns.  Discussed cluster feeding on days 2-3.  Instructed to feed with cues and call for assist prn.  Breastfeeding consultation services information given.  Maternal Data    Feeding Feeding Type: Breast Fed  LATCH Score                   Interventions    Lactation Tools Discussed/Used     Consult Status Consult Status: Follow-up Date: 10/01/19 Follow-up type: In-patient    [name] 09/30/2019, 11:35 AM

## 2019-09-30 NOTE — Anesthesia Postprocedure Evaluation (Signed)
Anesthesia Post Note  Patient: Kelsey Craig  Procedure(s) Performed: AN AD HOC LABOR EPIDURAL     Patient location during evaluation: Mother Baby Anesthesia Type: Epidural Level of consciousness: awake and alert Pain management: pain level controlled Vital Signs Assessment: post-procedure vital signs reviewed and stable Respiratory status: spontaneous breathing, nonlabored ventilation and respiratory function stable Cardiovascular status: stable Postop Assessment: no headache, no backache and epidural receding Anesthetic complications: no    Last Vitals:  Vitals:   09/30/19 0200 09/30/19 0559  BP: (!) 146/86 (!) 141/92  Pulse: 65 87  Resp: 19 18  Temp: 36.7 C 36.8 C  SpO2: 100% 100%    Last Pain:  Vitals:   09/30/19 0559  TempSrc: Oral  PainSc: 0-No pain   Pain Goal:                Epidural/Spinal Function Cutaneous sensation: Normal sensation (09/30/19 0722)  Barkley Boards

## 2020-06-16 ENCOUNTER — Encounter: Payer: BC Managed Care – PPO | Admitting: Obstetrics & Gynecology

## 2020-06-21 ENCOUNTER — Encounter: Payer: BC Managed Care – PPO | Admitting: Obstetrics & Gynecology

## 2021-01-11 ENCOUNTER — Encounter: Payer: BC Managed Care – PPO | Admitting: Obstetrics & Gynecology

## 2021-02-22 ENCOUNTER — Ambulatory Visit
Admission: RE | Admit: 2021-02-22 | Discharge: 2021-02-22 | Disposition: A | Discharge status: Home / Self Care | Payer: BC Managed Care – PPO | Source: Ambulatory Visit | Attending: Emergency Medicine | Admitting: Emergency Medicine

## 2021-02-22 ENCOUNTER — Other Ambulatory Visit: Payer: Self-pay

## 2021-02-22 VITALS — BP 132/88 | HR 66 | Temp 97.8°F | Resp 19

## 2021-02-22 DIAGNOSIS — R0981 Nasal congestion: Secondary | ICD-10-CM

## 2021-02-22 MED ORDER — AMOXICILLIN-POT CLAVULANATE 875-125 MG PO TABS: 1.0000 | ORAL_TABLET | Freq: Two times a day (BID) | ORAL | 0 refills | Status: AC

## 2021-02-22 NOTE — ED Triage Notes (Signed)
Sinus pressure and green drainage.  S/s started a week ago with what she thought was allergies.  Has tried mucinex but it makes her sick to her stomach.

## 2021-02-22 NOTE — ED Provider Notes (Signed)
[facility]   [id number] 02/22/21 Arrival Time: 1450   CC: sinus infection  SUBJECTIVE: History from: patient.  Kelsey Craig is a 30 y.o. female who presents with sinus pressure and green drainage x 1 week .  Denies sick exposure to COVID, flu or strep.  Denies recent travel.  Has tried OTC medications without relief.  Deneis aggravating factors.  Reports previous symptoms in the past.   Denies fever, chills, SOB, wheezing, chest pain, nausea, changes in bowel or bladder habits.    ROS: As per HPI.  All other pertinent ROS negative.     Past Medical History:  Diagnosis Date  . Hepatitis C   . History of alcohol abuse   . History of intravenous drug abuse [facility])    Past Surgical History:  Procedure Laterality Date  . BREAST MASS EXCISION    . BREAST SURGERY     Allergies  Allergen Reactions  . Latex Swelling   No current facility-administered medications on file prior to encounter.   Current Outpatient Medications on File Prior to Encounter  Medication Sig Dispense Refill  . benzocaine-Menthol (DERMOPLAST) 20-0.5 % AERO Apply 1 application topically as needed for irritation (perineal discomfort).    . coconut oil OIL Apply 1 application topically as needed.  0  . fluticasone (FLONASE) 50 MCG/ACT nasal spray Place 1 spray into both nostrils daily as needed for allergies or rhinitis.    Marland Kitchen ibuprofen (ADVIL) 600 MG tablet Take 1 tablet (600 mg total) by mouth every 6 (six) hours. 30 tablet 0  . NIFEdipine (ADALAT CC) 30 MG 24 hr tablet Take 1 tablet (30 mg total) by mouth daily. 30 tablet 1  . Prenatal Vit-Fe Fumarate-FA (PRENATAL VITAMIN PO) Take 1 tablet by mouth daily.      Social History   Socioeconomic History  . Marital status: Married    Spouse name: Not on file  . Number of children: Not on file  . Years of education: Not on file  . Highest education level: Not on file  Occupational History  . Not on file  Tobacco Use  . Smoking status: Former  Games developer  . Smokeless tobacco: Never Used  Substance and Sexual Activity  . Alcohol use: Not Currently  . Drug use: Not Currently    Comment: hx IV drug use and alcohol abuse  . Sexual activity: Yes    Birth control/protection: None  Other Topics Concern  . Not on file  Social History Narrative  . Not on file   Social Determinants of Health   Financial Resource Strain: Not on file  Food Insecurity: Not on file  Transportation Needs: Not on file  Physical Activity: Not on file  Stress: Not on file  Social Connections: Not on file  Intimate Partner Violence: Not on file   No family history on file.  OBJECTIVE:  Vitals:   02/22/21 1500  BP: 132/88  Pulse: 66  Resp: 19  Temp: 97.8 F (36.6 C)  TempSrc: Oral  SpO2: 97%     General appearance: alert; appears fatigued, but nontoxic; speaking in full sentences and tolerating own secretions HEENT: NCAT; Ears: EACs clear, TMs pearly gray; Eyes: PERRL.  EOM grossly intact. Nose: nares patent without rhinorrhea, Throat: oropharynx clear, tonsils non erythematous or enlarged, uvula midline  Neck: supple without LAD Lungs: unlabored respirations, symmetrical air entry; cough: mild; no respiratory distress; CTAB Heart: regular rate and rhythm.   Skin: warm and dry Psychological: alert and cooperative; normal mood  and affect  ASSESSMENT & PLAN:  1. Sinus congestion     Meds ordered this encounter  Medications  . amoxicillin-clavulanate (AUGMENTIN) 875-125 MG tablet    Sig: Take 1 tablet by mouth every 12 (twelve) hours for 10 days.    Dispense:  20 tablet    Refill:  0    Order Specific Question:   Supervising Provider    Answer:   Eustace Moore [[id number]]   Get plenty of rest and push fluids Augmentin for sinus infection Use OTC zyrtec for nasal congestion, runny nose, and/or sore throat Use OTC flonase for nasal congestion and runny nose Use medications daily for symptom relief Use OTC medications like ibuprofen  or tylenol as needed fever or pain Call or go to the ED if you have any new or worsening symptoms such as fever, worsening cough, shortness of breath, chest tightness, chest pain, turning blue, changes in mental status, etc...   Reviewed expectations re: course of current medical issues. Questions answered. Outlined signs and symptoms indicating need for more acute intervention. Patient verbalized understanding. After Visit Summary given.         Rennis Harding, PA-C 02/22/21 [phone]

## 2021-02-22 NOTE — Discharge Instructions (Signed)
Get plenty of rest and push fluids Augmentin for sinus infection Use OTC zyrtec for nasal congestion, runny nose, and/or sore throat Use OTC flonase for nasal congestion and runny nose Use medications daily for symptom relief Use OTC medications like ibuprofen or tylenol as needed fever or pain Call or go to the ED if you have any new or worsening symptoms such as fever, worsening cough, shortness of breath, chest tightness, chest pain, turning blue, changes in mental status, etc...  

## 2021-03-13 ENCOUNTER — Other Ambulatory Visit: Payer: Self-pay

## 2021-03-13 ENCOUNTER — Ambulatory Visit: Payer: BC Managed Care – PPO | Admitting: Obstetrics & Gynecology

## 2021-03-13 ENCOUNTER — Encounter: Payer: Self-pay | Admitting: Obstetrics & Gynecology

## 2021-03-13 VITALS — BP 106/74 | HR 72 | Ht 66.0 in | Wt 229.0 lb

## 2021-03-13 DIAGNOSIS — Z711 Person with feared health complaint in whom no diagnosis is made: Secondary | ICD-10-CM

## 2021-03-13 DIAGNOSIS — L309 Dermatitis, unspecified: Secondary | ICD-10-CM | POA: Diagnosis not present

## 2021-03-13 DIAGNOSIS — M542 Cervicalgia: Secondary | ICD-10-CM

## 2021-03-13 NOTE — Progress Notes (Addendum)
   GYN VISIT Patient name: Kelsey Craig MRN [id number]  Date of birth: [birth year] Chief Complaint:   Breast Problem  History of Present Illness:   Kelsey Craig is a 30 y.o. [phone]  female being seen today for breast concerns  Pt notes h/o benign breast mass at 30yo requiring removal.  Concerned about future breast cancer risk and need for mammogram.  She does note some lumps/bumps, but no discrete mass.  Reports diffuse breast tenderness (when kids are playing/jumping on her) and some lateral side pain bilaterally.  Denies nipple discharge bilaterally.  Denies trauma or injury Also noted skin itching/irritation bilateral breast/side- tried using Lotrimin does not seem to be helping.  She does note neck pain that she has been dealing with for a prolonged period.  She would consider breast reduction in the future.  Paternal grandmother- breast Ca >50yo     Patient's last menstrual period was 02/16/2021 (exact date).  Depression screen [facility] 2/9 03/13/2021  Decreased Interest 0  Down, Depressed, Hopeless 0  PHQ - 2 Score 0  Altered sleeping 1  Tired, decreased energy 1  Change in appetite 0  Feeling bad or failure about yourself  0  Trouble concentrating 0  Moving slowly or fidgety/restless 0  Suicidal thoughts 0  PHQ-9 Score 2     Review of Systems:   Pertinent items are noted in HPI Denies fever/chills, dizziness, headaches, visual disturbances, fatigue, shortness of breath, chest pain, abdominal pain, vomiting,  problems with periods, bowel movements, urination, or intercourse unless otherwise stated above.  Pertinent History Reviewed:  Reviewed past medical,surgical, social, obstetrical and family history.  Reviewed problem list, medications and allergies. Physical Assessment:   Vitals:   03/13/21 1353  BP: 106/74  Pulse: 72  Weight: 103.9 kg  Height: 5\' 6"  (1.676 m)  Body mass index is 36.96 kg/m.       Physical Examination:   General appearance: alert,  well appearing, and in no distress  Psych: mood appropriate, normal affect  Skin: warm & dry   Cardiovascular: normal heart rate noted  Respiratory: normal respiratory effort, no distress  Breast: large pendulous breast appreciated, no discrete mass or lesions noted, nipples unremarkable, no axillary lymphadenopathy  Extremities: no edema   Chaperone: declined    Assessment & Plan:  1) Breast concerns -no abnormalities noted on exam -reviewed screening guidelines and concerns that would warrant further examination such as nipple discharge.  Questions and concerns were addressed -f/u for annual, plan to start mammograms @ 28yo or if clinically indicated  2) Dermatitis -topical cream sent in, encouraged conservative therapy to keep area dry  No orders of the defined types were placed in this encounter.   Return in about 1 year (around 03/13/2022) for Annual.   03/15/2022, DO Attending Obstetrician & Gynecologist, [facility] for [facility], [facility]

## 2021-03-14 MED ORDER — TRIAMCINOLONE ACETONIDE 0.5 % EX OINT: 1.0000 "application " | TOPICAL_OINTMENT | Freq: Two times a day (BID) | CUTANEOUS | 0 refills | Status: AC | PRN

## 2021-03-14 NOTE — Addendum Note (Signed)
Addended by: Sharon Seller on: 03/14/2021 03:41 PM   Modules accepted: Orders

## 2021-03-24 ENCOUNTER — Other Ambulatory Visit: Payer: Self-pay

## 2021-03-24 ENCOUNTER — Encounter: Payer: Self-pay | Admitting: Emergency Medicine

## 2021-03-24 ENCOUNTER — Ambulatory Visit
Admission: EM | Admit: 2021-03-24 | Discharge: 2021-03-24 | Disposition: A | Discharge status: Home / Self Care | Payer: BC Managed Care – PPO | Attending: Emergency Medicine | Admitting: Emergency Medicine

## 2021-03-24 DIAGNOSIS — J020 Streptococcal pharyngitis: Secondary | ICD-10-CM | POA: Diagnosis not present

## 2021-03-24 LAB — POCT RAPID STREP A (OFFICE): Rapid Strep A Screen: POSITIVE — AB

## 2021-03-24 MED ORDER — PENICILLIN V POTASSIUM 500 MG PO TABS: 500.0000 mg | ORAL_TABLET | Freq: Two times a day (BID) | ORAL | 0 refills | Status: AC

## 2021-03-24 MED ORDER — IBUPROFEN 600 MG PO TABS: 600.0000 mg | ORAL_TABLET | Freq: Four times a day (QID) | ORAL | 0 refills | Status: DC | PRN

## 2021-03-24 NOTE — ED Provider Notes (Signed)
HPI  SUBJECTIVE:  Patient reports sore throat starting last night. Sx worse with swallowing.  Sx better with Tylenol.  No fever + Swollen neck glands worse on the left side No neck stiffness  No Cough No nasal congestion, rhinorrhea No Myalgias No Headache No Rash  No loss of taste or smell No shortness of breath or difficulty breathing No nausea, vomiting No diarrhea No abdominal pain     No Recent Strep, COVID exposure No reflux sxs No Allergy sxs  No Breathing difficulty, voice changes, sensation of throat swelling shut No Drooling No Trismus No abx in past month.  She did not get the COVID-vaccine No antipyretic in past 4-6 hrs  Past medical history frequent strep, status post tonsillectomy, hepatitis C, treated, with no residual liver damage. LMP: 5/30 [facility] [place]    Past Medical History:  Diagnosis Date  . Hepatitis C   . History of alcohol abuse   . History of intravenous drug abuse [facility])     Past Surgical History:  Procedure Laterality Date  . BREAST MASS EXCISION    . BREAST SURGERY      Family History  Problem Relation Age of Onset  . Hypertension Mother   . Diabetes Father     Social History   Tobacco Use  . Smoking status: Former Games developer  . Smokeless tobacco: Never Used  Vaping Use  . Vaping Use: Never used  Substance Use Topics  . Alcohol use: Yes    Comment: occ  . Drug use: Not Currently    Comment: hx IV drug use and alcohol abuse    No current facility-administered medications for this encounter.  Current Outpatient Medications:  .  ibuprofen (ADVIL) 600 MG tablet, Take 1 tablet (600 mg total) by mouth every 6 (six) hours as needed., Disp: 30 tablet, Rfl: 0 .  penicillin v potassium (VEETID) 500 MG tablet, Take 1 tablet (500 mg total) by mouth 2 (two) times daily for 10 days. X 10 days, Disp: 20 tablet, Rfl: 0  Allergies  Allergen Reactions  . Latex Swelling     ROS  As noted in HPI.   Physical  Exam  BP 129/84 (BP Location: Right Arm)   Pulse 88   Temp 97.8 F (36.6 C)   Resp 16   LMP 03/20/2021   SpO2 96%   Constitutional: Well developed, well nourished, no acute distress Eyes:  EOMI, conjunctiva normal bilaterally HENT: Normocephalic, atraumatic,mucus membranes moist. - nasal congestion +erythematous oropharynx. tonsils surgically absent.  No soft palate swelling.   Uvula midline.  Respiratory: Normal inspiratory effort Cardiovascular: Normal rate, no murmurs, rubs, gallops GI: nondistended, nontender. No appreciable splenomegaly skin: No rash, skin intact Lymph + tender left-sided anterior cervical LN.  No posterior cervical lymphadenopathy Musculoskeletal: no deformities Neurologic: Alert & oriented x 3, no focal neuro deficits Psychiatric: Speech and behavior appropriate.   ED Course   Medications - No data to display  Orders Placed This Encounter  Procedures  . Novel Coronavirus, NAA ([facility])    Standing Status:   Standing    Number of Occurrences:   1  . POCT rapid strep A    Standing Status:   Standing    Number of Occurrences:   1    Results for orders placed or performed during the hospital encounter of 03/24/21 (from the past 24 hour(s))  POCT rapid strep A     Status: Abnormal   Collection Time: 03/24/21  8:19 PM  Result Value  Ref Range   Rapid Strep A Screen Positive (A) Negative   No results found.  ED Clinical Impression  1. Strep pharyngitis      ED Assessment/Plan  Rapid strep positive. Sending home with penicillin for 10 days.  will also check COVID.  She will be a candidate for antiviral therapy if COVID is positive based on unvaccinated status and BMI of 37. she states that she will check at home COVID test. Home with ibuprofen, Tylenol, Benadryl/Maalox mixture. Patient to followup with PMD when necessary.   Discussed labs,  MDM, plan and followup with patient. Discussed sn/sx that should prompt return to the ED. patient agrees  with plan.   Meds ordered this encounter  Medications  . penicillin v potassium (VEETID) 500 MG tablet    Sig: Take 1 tablet (500 mg total) by mouth 2 (two) times daily for 10 days. X 10 days    Dispense:  20 tablet    Refill:  0  . ibuprofen (ADVIL) 600 MG tablet    Sig: Take 1 tablet (600 mg total) by mouth every 6 (six) hours as needed.    Dispense:  30 tablet    Refill:  0     *This clinic note was created using Scientist, clinical (histocompatibility and immunogenetics). Therefore, there may be occasional mistakes despite careful proofreading.     Domenick Gong, MD 03/25/21 [phone]

## 2021-03-24 NOTE — ED Triage Notes (Signed)
Sore throat since last night,  States left side of neck is tender and sore at lymph nodes.

## 2021-03-24 NOTE — Discharge Instructions (Addendum)
Your rapid strep was positive today.  I am treating you with penicillin.  Your COVID test will be back in a day or 2.  Wear mask until you know the results of your COVID test.  I would also suggest trying a home COVID test.  1 gram of Tylenol and 600 mg ibuprofen together 3-4 times a day as needed for pain.  Make sure you drink plenty of extra fluids.  Some people find salt water gargles and  Traditional Medicinal's "Throat Coat" tea helpful. Take 5 mL of liquid Benadryl and 5 mL of Maalox. Mix it together, and then hold it in your mouth for as long as you can and then swallow. You may do this 4 times a day.    Go to www.goodrx.com  or www.costplusdrugs.com to look up your medications. This will give you a list of where you can find your prescriptions at the most affordable prices. Or ask the pharmacist what the cash price is, or if they have any other discount programs available to help make your medication more affordable. This can be less expensive than what you would pay with insurance.

## 2021-03-27 LAB — NOVEL CORONAVIRUS, NAA

## 2021-08-03 ENCOUNTER — Other Ambulatory Visit: Payer: Self-pay

## 2021-08-03 ENCOUNTER — Emergency Department (HOSPITAL_COMMUNITY): Payer: BC Managed Care – PPO

## 2021-08-03 ENCOUNTER — Encounter (HOSPITAL_COMMUNITY): Payer: Self-pay

## 2021-08-03 ENCOUNTER — Emergency Department (HOSPITAL_COMMUNITY)
Admission: EM | Admit: 2021-08-03 | Discharge: 2021-08-04 | Disposition: A | Discharge status: Home / Self Care | Payer: BC Managed Care – PPO | Attending: Emergency Medicine | Admitting: Emergency Medicine

## 2021-08-03 DIAGNOSIS — S00211A Abrasion of right eyelid and periocular area, initial encounter: Secondary | ICD-10-CM | POA: Insufficient documentation

## 2021-08-03 DIAGNOSIS — R519 Headache, unspecified: Secondary | ICD-10-CM | POA: Diagnosis not present

## 2021-08-03 DIAGNOSIS — R55 Syncope and collapse: Secondary | ICD-10-CM | POA: Insufficient documentation

## 2021-08-03 DIAGNOSIS — W01198A Fall on same level from slipping, tripping and stumbling with subsequent striking against other object, initial encounter: Secondary | ICD-10-CM | POA: Insufficient documentation

## 2021-08-03 DIAGNOSIS — R42 Dizziness and giddiness: Secondary | ICD-10-CM | POA: Insufficient documentation

## 2021-08-03 DIAGNOSIS — S0993XA Unspecified injury of face, initial encounter: Secondary | ICD-10-CM | POA: Diagnosis present

## 2021-08-03 DIAGNOSIS — Z87891 Personal history of nicotine dependence: Secondary | ICD-10-CM | POA: Diagnosis not present

## 2021-08-03 DIAGNOSIS — Y92000 Kitchen of unspecified non-institutional (private) residence as  the place of occurrence of the external cause: Secondary | ICD-10-CM | POA: Diagnosis not present

## 2021-08-03 DIAGNOSIS — Z91040 Latex allergy status: Secondary | ICD-10-CM | POA: Insufficient documentation

## 2021-08-03 LAB — COMPREHENSIVE METABOLIC PANEL
ALT: 20 U/L (ref 0–44)
AST: 19 U/L (ref 15–41)
Albumin: 4.3 g/dL (ref 3.5–5.0)
Alkaline Phosphatase: 91 U/L (ref 38–126)
Anion gap: 7 (ref 5–15)
BUN: 13 mg/dL (ref 6–20)
CO2: 23 mmol/L (ref 22–32)
Calcium: 9.1 mg/dL (ref 8.9–10.3)
Chloride: 105 mmol/L (ref 98–111)
Creatinine, Ser: 0.98 mg/dL (ref 0.44–1.00)
GFR, Estimated: >60 mL/min (ref >60)
Glucose, Bld: 92 mg/dL (ref 70–99)
Potassium: 3.7 mmol/L (ref 3.5–5.1)
Sodium: 135 mmol/L (ref 135–145)
Total Bilirubin: 0.9 mg/dL (ref 0.3–1.2)
Total Protein: 7.6 g/dL (ref 6.5–8.1)

## 2021-08-03 LAB — CBC WITH DIFFERENTIAL/PLATELET
Abs Immature Granulocytes: 0.02 10*3/uL (ref 0.00–0.07)
Basophils Absolute: 0 10*3/uL (ref 0.0–0.1)
Basophils Relative: 0 %
Eosinophils Absolute: 0.4 10*3/uL (ref 0.0–0.5)
Eosinophils Relative: 5 %
HCT: 42.2 % (ref 36.0–46.0)
Hemoglobin: 14.5 g/dL (ref 12.0–15.0)
Immature Granulocytes: 0 %
Lymphocytes Relative: 34 %
Lymphs Abs: 3 10*3/uL (ref 0.7–4.0)
MCH: 30.6 pg (ref 26.0–34.0)
MCHC: 34.4 g/dL (ref 30.0–36.0)
MCV: 89 fL (ref 80.0–100.0)
Monocytes Absolute: 0.6 10*3/uL (ref 0.1–1.0)
Monocytes Relative: 7 %
Neutro Abs: 4.6 10*3/uL (ref 1.7–7.7)
Neutrophils Relative %: 54 %
Platelets: 287 10*3/uL (ref 150–400)
RBC: 4.74 MIL/uL (ref 3.87–5.11)
RDW: 13 % (ref 11.5–15.5)
WBC: 8.7 10*3/uL (ref 4.0–10.5)
nRBC: 0 % (ref 0.0–0.2)

## 2021-08-03 LAB — I-STAT BETA HCG BLOOD, ED (MC, WL, AP ONLY): I-stat hCG, quantitative: <5 m[IU]/mL (ref <5)

## 2021-08-03 LAB — TROPONIN I (HIGH SENSITIVITY): Troponin I (High Sensitivity): <2 ng/L (ref <18)

## 2021-08-03 MED ORDER — SODIUM CHLORIDE 0.9 % IV BOLUS
1000.0000 mL | Freq: Once | INTRAVENOUS | Status: AC
  Administered 2021-08-03: 1000 mL via INTRAVENOUS

## 2021-08-03 MED ORDER — ONDANSETRON HCL 4 MG/2ML IJ SOLN
4.0000 mg | Freq: Once | INTRAMUSCULAR | Status: AC
  Administered 2021-08-03: 4 mg via INTRAVENOUS
  Filled 2021-08-03: qty 2

## 2021-08-03 MED ORDER — ACETAMINOPHEN 325 MG PO TABS
650.0000 mg | ORAL_TABLET | Freq: Once | ORAL | Status: AC
  Administered 2021-08-03: 650 mg via ORAL
  Filled 2021-08-03: qty 2

## 2021-08-03 NOTE — Discharge Instructions (Signed)

## 2021-08-03 NOTE — ED Triage Notes (Signed)
Pt reports standing in kitchen today, felt a "wave of heat and nausea" then had syncopal episode, fell on floor, hit right side of head, (small lac noted to right eye) and has headache now.

## 2021-08-03 NOTE — ED Provider Notes (Signed)
Present  Emergency Department Provider Note   I have reviewed the triage vital signs and the nursing notes.   HISTORY  Chief Complaint Loss of Consciousness   HPI Kelsey Craig is a 30 y.o. female with past medical history reviewed below to the emergency department for evaluation of syncope.  Patient states she has leaning against her refrigerator and responding to the message when she suddenly felt very hot and flushed along with lightheadedness.  She developed nausea and then woke up on the floor.  Her husband and 16-year-old were home and did not witness any seizure activity.  She felt slightly dazed for several minutes after the event.  No tongue biting or urine incontinence.  She does have a headache and sustained a small laceration of the right eyebrow.  She states she did have 1 episode of syncope in the past and was evaluated by cardiologist with no clear cardiac etiology for syncope found.  No new medications or change to her routine.  She has been eating and drinking well.  States that now she is feeling well other than a headache.  Tetanus is up-to-date.  Denies neck or back pain.  No pain in extremities.   Past Medical History:  Diagnosis Date   Hepatitis C    History of alcohol abuse    History of intravenous drug abuse [facility])     Patient Active Problem List   Diagnosis Date Noted   Gestational hypertension 09/29/2019   Encounter for planned induction of labor 09/28/2019   SVD  03/29/2018   Postpartum care following vaginal delivery (12/8) 03/29/2018   Second-degree perineal laceration, with delivery 03/29/2018   Chronic hepatitis C affecting pregnancy, antepartum (HCC) 02/20/2018    Past Surgical History:  Procedure Laterality Date   BREAST MASS EXCISION     BREAST SURGERY      Allergies Latex  Family History  Problem Relation Age of Onset   Hypertension Mother    Diabetes Father     Social History Social History   Tobacco Use   Smoking status:  Former   Smokeless tobacco: Never  Building services engineer Use: Never used  Substance Use Topics   Alcohol use: Yes   Drug use: Not Currently    Comment: hx IV drug use and alcohol abuse    Review of Systems  Constitutional: No fever/chills Eyes: No visual changes. ENT: No sore throat. Cardiovascular: Denies chest pain. Positive syncope.  Respiratory: Denies shortness of breath. Gastrointestinal: No abdominal pain.  No nausea, no vomiting.  No diarrhea.  No constipation. Genitourinary: Negative for dysuria. Musculoskeletal: Negative for back pain. Skin: Negative for rash. Right eyebrow laceration.  Neurological: Negative for  focal weakness or numbness. Positive HA.   10-point ROS otherwise negative.  ____________________________________________   PHYSICAL EXAM:  VITAL SIGNS: ED Triage Vitals  Enc Vitals Group     BP 08/03/21 2104 (!) 153/107     Pulse Rate 08/03/21 2104 93     Resp 08/03/21 2104 17     Temp 08/03/21 2104 97.8 F (36.6 C)     Temp Source 08/03/21 2104 Oral     SpO2 08/03/21 2104 100 %     Weight 08/03/21 2104 215 lb (97.5 kg)     Height 08/03/21 2104 5\' 6"  (1.676 m)    Constitutional: Alert and oriented. Well appearing and in no acute distress. Eyes: Conjunctivae are normal. 0.5 cm superficial abrasion over the right eyebrow.  Head: Atraumatic. Nose: No congestion/rhinnorhea.  Mouth/Throat: Mucous membranes are moist.  Neck: No stridor. No cervical spine tenderness to palpation. Cardiovascular: Normal rate, regular rhythm. Good peripheral circulation. Grossly normal heart sounds.   Respiratory: Normal respiratory effort.  No retractions. Lungs CTAB. Gastrointestinal: Soft and nontender. No distention.  Musculoskeletal: No lower extremity tenderness nor edema. No gross deformities of extremities. Normal ROM of the bilateral upper/lower extremities.  Neurologic:  Normal speech and language. No gross focal neurologic deficits are appreciated.  Skin:   Skin is warm and dry. No rash noted.   ____________________________________________   LABS (all labs ordered are listed, but only abnormal results are displayed)  Labs Reviewed  COMPREHENSIVE METABOLIC PANEL  CBC WITH DIFFERENTIAL/PLATELET  I-STAT BETA HCG BLOOD, ED ([facility], [facility], [facility] ONLY)  TROPONIN I (HIGH SENSITIVITY)   ____________________________________________  EKG   EKG Interpretation  Date/Time:  Thursday August 03 2021 23:43:00 EDT Ventricular Rate:  65 PR Interval:  137 QRS Duration: 92 QT Interval:  387 QTC Calculation: 403 R Axis:   73 Text Interpretation: Sinus rhythm No previous ECGs available Confirmed by Zadie Rhine ([id number]) on 08/04/2021 12:38:11 AM        ____________________________________________  RADIOLOGY  CT Head Wo Contrast  Result Date: 08/03/2021 CLINICAL DATA:  Syncope.  Fell and hit floor. EXAM: CT HEAD WITHOUT CONTRAST TECHNIQUE: Contiguous axial images were obtained from the base of the skull through the vertex without intravenous contrast. COMPARISON:  None. FINDINGS: Brain: There is no evidence for acute hemorrhage, hydrocephalus, mass lesion, or abnormal extra-axial fluid collection. No definite CT evidence for acute infarction. Vascular: No hyperdense vessel or unexpected calcification. Skull: No evidence for fracture. No worrisome lytic or sclerotic lesion. Sinuses/Orbits: Mucosal thickening noted sphenoid sinuses. Remaining paranasal sinuses and mastoid air cells are clear. Visualized portions of the globes and intraorbital fat are unremarkable. Other: None. IMPRESSION: 1. No acute intracranial abnormality. 2. Chronic sphenoid sinus disease. Electronically Signed   By: Kennith Center M.D.   On: 08/03/2021 21:56    ____________________________________________   PROCEDURES  Procedure(s) performed:   Procedures  None  ____________________________________________   INITIAL IMPRESSION / ASSESSMENT AND PLAN / ED COURSE  Pertinent  labs & imaging results that were available during my care of the patient were reviewed by me and considered in my medical decision making (see chart for details).   Patient presents emergency department for evaluation of syncope.  She had flushed/nauseated feeling prior to passing out.  Likely vasovagal based on history.  Mild hypertension on arrival with no prior history.  CT imaging of the head obtained during triage process with no acute finding.  Patient not with significant concussion symptoms.  She has a small abrasion to the eyebrow which I cleaned at bedside but does not require suture or tissue adhesive.  Plan for screening lab work including pregnancy, CBC, chemistry, IV fluids, orthostatic vital signs.  Will need to evaluate for possible cardiogenic syncope although low suspicion for this.  We will also evaluate for acute kidney injury, electrolyte disturbance, anemia.  Labs reviewed. Patient to be re-evaluated by Dr. Bebe Shaggy. Anticipate d/c after IVF.  ____________________________________________  FINAL CLINICAL IMPRESSION(S) / ED DIAGNOSES  Final diagnoses:  Syncope and collapse     MEDICATIONS GIVEN DURING THIS VISIT:  Medications  sodium chloride 0.9 % bolus 1,000 mL (0 mLs Intravenous Stopped 08/04/21 0032)  ondansetron (ZOFRAN) injection 4 mg (4 mg Intravenous Given 08/03/21 2340)  acetaminophen (TYLENOL) tablet 650 mg (650 mg Oral Given 08/03/21 2340)  Note:  This document was prepared using Dragon voice recognition software and may include unintentional dictation errors.  Alona Bene, MD, [facility] Emergency Medicine    Sean Malinowski, Arlyss Repress, MD 08/09/21 [phone]

## 2021-08-04 NOTE — ED Provider Notes (Signed)
Patient feels improved.  She reports she has ambulated.  Labs overall reassuring.  No significant tachycardia or hypoxia.  No dysrhythmias on monitoring Patient will be referred to cardiology as she reports she has had this previously.   Zadie Rhine, MD 08/04/21 [phone]

## 2022-03-26 ENCOUNTER — Ambulatory Visit: Payer: BC Managed Care – PPO | Admitting: Psychiatry

## 2022-04-02 ENCOUNTER — Ambulatory Visit: Payer: BC Managed Care – PPO | Admitting: Psychiatry

## 2022-04-02 ENCOUNTER — Encounter: Payer: Self-pay | Admitting: *Deleted

## 2022-04-02 ENCOUNTER — Telehealth: Payer: Self-pay

## 2022-04-02 VITALS — BP 144/92 | HR 66 | Ht 66.0 in | Wt 226.0 lb

## 2022-04-02 DIAGNOSIS — G43019 Migraine without aura, intractable, without status migrainosus: Secondary | ICD-10-CM

## 2022-04-02 DIAGNOSIS — R55 Syncope and collapse: Secondary | ICD-10-CM | POA: Diagnosis not present

## 2022-04-02 MED ORDER — EMGALITY 120 MG/ML ~~LOC~~ SOAJ: 120.0000 mg | SUBCUTANEOUS | 6 refills | Status: DC

## 2022-04-02 MED ORDER — RIZATRIPTAN BENZOATE 10 MG PO TABS: 10.0000 mg | ORAL_TABLET | ORAL | 6 refills | Status: AC | PRN

## 2022-04-02 NOTE — Progress Notes (Signed)
Referring:  Mickie KayJones, Caroline C, PA [address] [place] [id number]  PCP: [facility] was asked to evaluate Kelsey NevinsMary Craig, a 31 year old female for a chief complaint of headaches.  Our recommendations of care will be communicated by shared medical record.    CC:  headaches  History provided from self  HPI:  Medical co-morbidities: hepatitis C s/p treatment  The patient presents for evaluation of headaches which began when she was a teenager. They have gotten more frequent over time. She currently has daily headaches with one migraine per week. Takes ibuprofen and Tylenol daily.  She has taken Vanuatubrelvy as needed which does help, but insurance didn't cover it.  She has been having episodes of deja vu which have been becoming more frequent. Occurs once every other week. She had one episode of deja vu where she passed out afterwards in October 2022. Had tunnel vision beforehand. Passed out for about 30 seconds. Did not bite her tongue or lose control of bowel/bladder. Felt fatigued and confused for a couple of days afterwards. She did have a headache afterwards. CTH in the ED was unremarkable.  Has a history of bone spurs in her neck. Got an MRI c-spine in September 2022 which was unremarkable.  Headache History: Onset: 31 years old Triggers: lights, loud noises Aura: blurry Location: temples Quality/Description: throbbing Associated Symptoms:  Photophobia: yes  Phonophobia: yes  Nausea: yes Vomiting: rarely Worse with activity?: yes Duration of headaches: 4 hours  Migraine days per month: 4 Headache days per month: 30  Current Treatment: Abortive Tylenol Ibuprofen  Preventative none  Prior Therapies                                 Ubrelvy - helped but insurance didn't cover Imitrex - side effects (burning skin) Topamax - lack of efficacy Amitriptyline - lack off efficacy  LABS: CBC    Component Value Date/Time   WBC 8.7  08/03/2021 2237   RBC 4.74 08/03/2021 2237   HGB 14.5 08/03/2021 2237   HCT 42.2 08/03/2021 2237   PLT 287 08/03/2021 2237   MCV 89.0 08/03/2021 2237   MCH 30.6 08/03/2021 2237   MCHC 34.4 08/03/2021 2237   RDW 13.0 08/03/2021 2237   LYMPHSABS 3.0 08/03/2021 2237   MONOABS 0.6 08/03/2021 2237   EOSABS 0.4 08/03/2021 2237   BASOSABS 0.0 08/03/2021 2237      Latest Ref Rng & Units 08/03/2021   10:37 PM 09/30/2019    5:22 AM 09/29/2019   12:29 AM  CMP  Glucose 70 - 99 mg/dL 92  [id number]  75   BUN 6 - 20 mg/dL 13  5  7    Creatinine 0.44 - 1.00 mg/dL 7.820.98  9.560.65  2.130.62   Sodium 135 - 145 mmol/L 135  137  136   Potassium 3.5 - 5.1 mmol/L 3.7  3.8  3.7   Chloride 98 - 111 mmol/L 105  104  105   CO2 22 - 32 mmol/L 23  22  20    Calcium 8.9 - 10.3 mg/dL 9.1  9.3  08.610.0   Total Protein 6.5 - 8.1 g/dL 7.6  5.7  6.2   Total Bilirubin 0.3 - 1.2 mg/dL 0.9  0.9  1.1   Alkaline Phos 38 - 126 U/L 91  130  148   AST 15 - 41 U/L 19  34  23   ALT 0 - 44 U/L 20  22  22       IMAGING:  [facility] 08/03/21: no acute intracranial abnormality, chronic sphenoid sinus disease  Imaging independently reviewed on April 02, 2022   Current Outpatient Medications on File Prior to Visit  Medication Sig Dispense Refill   ibuprofen (ADVIL) 600 MG tablet Take 1 tablet (600 mg total) by mouth every 6 (six) hours as needed. 30 tablet 0   No current facility-administered medications on file prior to visit.     Allergies: Allergies  Allergen Reactions   Latex Swelling    hives    Family History: Migraine or other headaches in the family:  grandmother and uncle have migraines Aneurysms in a first degree relative:  no Brain tumors in the family:  no Other neurological illness in the family:   no  Past Medical History: Past Medical History:  Diagnosis Date   Hepatitis C    History of alcohol abuse    History of intravenous drug abuse (HCC)    Migraine     Past Surgical History Past Surgical History:   Procedure Laterality Date   BREAST MASS EXCISION     BREAST SURGERY      Social History: Social History   Tobacco Use   Smoking status: Former   Smokeless tobacco: Never  April 04, 2022 Use: Never used  Substance Use Topics   Alcohol use: Yes   Drug use: Not Currently    Comment: hx IV drug use and alcohol abuse    ROS: Negative for fevers, chills. Positive for headaches, syncope. All other systems reviewed and negative unless stated otherwise in HPI.   Physical Exam:   Vital Signs: BP (!) 144/92   Pulse 66   Ht 5\' 6"  (1.676 m)   Wt 226 lb (102.5 kg)   BMI 36.48 kg/m  GENERAL: well appearing,in no acute distress,alert SKIN:  Color, texture, turgor normal. No rashes or lesions HEAD:  Normocephalic/atraumatic. CV:  RRR RESP: Normal respiratory effort MSK: no tenderness to palpation over occiput, neck, or shoulders  NEUROLOGICAL: Mental Status: Alert, oriented to person, place and time,Follows commands Cranial Nerves: PERRL, visual fields intact to confrontation, extraocular movements intact, facial sensation intact, no facial droop or ptosis, hearing grossly intact, no dysarthria Motor: muscle strength 5/5 both upper and lower extremities,no drift, normal tone Reflexes: 2+ throughout Sensation: intact to light touch all 4 extremities Coordination: Finger-to- nose-finger intact bilaterally Gait: normal-based   IMPRESSION: 31 year old female who presents for evaluation of migraines and syncope. Will order EEG as she does report frequent episodes of deja vu and did experience deja vu prior to her loss of consciousness in October. She has failed multiple migraine preventives due to lack of efficacy. Will start Emgality for migraine prevention and Maxalt for rescue.  PLAN: -Routine EEG -Prevention: Start Emgality 120 mg monthly. Loading does given in office today -Rescue: Start Maxalt 10 mg PRN -Counseled on limiting OTCs to 2 days per week to avoid rebound  headaches -next steps: consider Ubrelvy for rescue, consider Cymbalta, Botox, or Qulipta for prevention   I spent a total of 38 minutes chart reviewing and counseling the patient. Headache education was done. Discussed treatment options including preventive and acute medications. Discussed medication overuse headache and to limit use of acute treatments to no more than 2 days/week or 10 days/month. Discussed medication side effects, adverse reactions and drug interactions. Written educational materials and patient instructions outlining all of  the above were given.  Follow-up: 5 months   Ocie Doyne, MD 04/02/2022   1:29 PM

## 2022-04-02 NOTE — Telephone Encounter (Signed)
Scheduled pt for EEG on 6/19

## 2022-04-02 NOTE — Patient Instructions (Signed)
Start rizatriptan as needed for migraines. Take at the onset of migraine. If headache recurs or does not fully resolve, you may take a second dose after 2 hours. Please avoid taking more than 2 days per week or 10 days per month. Start Emgality monthly for headache prevention EEG

## 2022-04-02 NOTE — Telephone Encounter (Signed)
Patient left a voicemail with our office this morning asking for a call to schedule her EEG that she reports Dr. [place] ordered for her.

## 2022-04-04 ENCOUNTER — Telehealth: Payer: Self-pay | Admitting: *Deleted

## 2022-04-04 NOTE — Telephone Encounter (Signed)
Received PA, completed, placed on MD desk for review, signature.

## 2022-04-04 NOTE — Telephone Encounter (Signed)
[employer] PA, Key: [id number], [id number].  Member Inquiry service failure or member validation failure.

## 2022-04-04 NOTE — Telephone Encounter (Signed)
PA completed, signed and faxed to [facility]. Office notes attached. Received confirmation.

## 2022-04-04 NOTE — Telephone Encounter (Signed)
Unable to complete 2nd PA on CMM. Member not found. [employer], spoke with [facility],  will fax PA form and advised to attach office notes. Fax [phone].

## 2022-04-09 ENCOUNTER — Encounter: Payer: Self-pay | Admitting: *Deleted

## 2022-04-09 ENCOUNTER — Ambulatory Visit: Payer: BC Managed Care – PPO | Admitting: Neurology

## 2022-04-09 DIAGNOSIS — R55 Syncope and collapse: Secondary | ICD-10-CM

## 2022-04-09 NOTE — Telephone Encounter (Signed)
[employer] approved 02/04/22 to 04/06/23. Advised patient via my chart.

## 2022-04-11 ENCOUNTER — Other Ambulatory Visit: Payer: Self-pay | Admitting: Podiatry

## 2022-04-11 NOTE — Patient Instructions (Signed)
Your procedure is scheduled on: 04/18/2022  Report to [facility] Main Entrance at  9:00   AM.  Call this number if you have problems the morning of surgery: [phone]   Remember:   Do not Eat or Drink after midnight         No Smoking the morning of surgery  :  Take these medicines the morning of surgery with A SIP OF WATER: Claritin and flonase if needed   Do not wear jewelry, make-up or nail polish.  Do not wear lotions, powders, or perfumes. You may wear deodorant.  Do not shave 48 hours prior to surgery. Men may shave face and neck.  Do not bring valuables to the hospital.  Contacts, dentures or bridgework may not be worn into surgery.  Leave suitcase in the car. After surgery it may be brought to your room.  For patients admitted to the hospital, checkout time is 11:00 AM the day of discharge.   Patients discharged the day of surgery will not be allowed to drive home.    Special Instructions: Shower using CHG night before surgery and shower the day of surgery use CHG.  Use special wash - you have one bottle of CHG for all showers.  You should use approximately 1/2 of the bottle for each shower.  How to Use Chlorhexidine for Bathing Chlorhexidine gluconate (CHG) is a germ-killing (antiseptic) solution that is used to clean the skin. It can get rid of the bacteria that normally live on the skin and can keep them away for about 24 hours. To clean your skin with CHG, you may be given: A CHG solution to use in the shower or as part of a sponge bath. A prepackaged cloth that contains CHG. Cleaning your skin with CHG may help lower the risk for infection: While you are staying in the intensive care unit of the hospital. If you have a vascular access, such as a central line, to provide short-term or long-term access to your veins. If you have a catheter to drain urine from your bladder. If you are on a ventilator. A ventilator is a machine that helps you breathe by moving air in and  out of your lungs. After surgery. What are the risks? Risks of using CHG include: A skin reaction. Hearing loss, if CHG gets in your ears and you have a perforated eardrum. Eye injury, if CHG gets in your eyes and is not rinsed out. The CHG product catching fire. Make sure that you avoid smoking and flames after applying CHG to your skin. Do not use CHG: If you have a chlorhexidine allergy or have previously reacted to chlorhexidine. On babies younger than 84 months of age. How to use CHG solution Use CHG only as told by your health care provider, and follow the instructions on the label. Use the full amount of CHG as directed. Usually, this is one bottle. During a shower Follow these steps when using CHG solution during a shower (unless your health care provider gives you different instructions): Start the shower. Use your normal soap and shampoo to wash your face and hair. Turn off the shower or move out of the shower stream. Pour the CHG onto a clean washcloth. Do not use any type of brush or rough-edged sponge. Starting at your neck, lather your body down to your toes. Make sure you follow these instructions: If you will be having surgery, pay special attention to the part of your body where you will  be having surgery. Scrub this area for at least 1 minute. Do not use CHG on your head or face. If the solution gets into your ears or eyes, rinse them well with water. Avoid your genital area. Avoid any areas of skin that have broken skin, cuts, or scrapes. Scrub your back and under your arms. Make sure to wash skin folds. Let the lather sit on your skin for 1-2 minutes or as long as told by your health care provider. Thoroughly rinse your entire body in the shower. Make sure that all body creases and crevices are rinsed well. Dry off with a clean towel. Do not put any substances on your body afterward--such as powder, lotion, or perfume--unless you are told to do so by your health care  provider. Only use lotions that are recommended by the manufacturer. Put on clean clothes or pajamas. If it is the night before your surgery, sleep in clean sheets.  During a sponge bath Follow these steps when using CHG solution during a sponge bath (unless your health care provider gives you different instructions): Use your normal soap and shampoo to wash your face and hair. Pour the CHG onto a clean washcloth. Starting at your neck, lather your body down to your toes. Make sure you follow these instructions: If you will be having surgery, pay special attention to the part of your body where you will be having surgery. Scrub this area for at least 1 minute. Do not use CHG on your head or face. If the solution gets into your ears or eyes, rinse them well with water. Avoid your genital area. Avoid any areas of skin that have broken skin, cuts, or scrapes. Scrub your back and under your arms. Make sure to wash skin folds. Let the lather sit on your skin for 1-2 minutes or as long as told by your health care provider. Using a different clean, wet washcloth, thoroughly rinse your entire body. Make sure that all body creases and crevices are rinsed well. Dry off with a clean towel. Do not put any substances on your body afterward--such as powder, lotion, or perfume--unless you are told to do so by your health care provider. Only use lotions that are recommended by the manufacturer. Put on clean clothes or pajamas. If it is the night before your surgery, sleep in clean sheets. How to use CHG prepackaged cloths Only use CHG cloths as told by your health care provider, and follow the instructions on the label. Use the CHG cloth on clean, dry skin. Do not use the CHG cloth on your head or face unless your health care provider tells you to. When washing with the CHG cloth: Avoid your genital area. Avoid any areas of skin that have broken skin, cuts, or scrapes. Before surgery Follow these steps  when using a CHG cloth to clean before surgery (unless your health care provider gives you different instructions): Using the CHG cloth, vigorously scrub the part of your body where you will be having surgery. Scrub using a back-and-forth motion for 3 minutes. The area on your body should be completely wet with CHG when you are done scrubbing. Do not rinse. Discard the cloth and let the area air-dry. Do not put any substances on the area afterward, such as powder, lotion, or perfume. Put on clean clothes or pajamas. If it is the night before your surgery, sleep in clean sheets.  For general bathing Follow these steps when using CHG cloths for general bathing (unless  your health care provider gives you different instructions). Use a separate CHG cloth for each area of your body. Make sure you wash between any folds of skin and between your fingers and toes. Wash your body in the following order, switching to a new cloth after each step: The front of your neck, shoulders, and chest. Both of your arms, under your arms, and your hands. Your stomach and groin area, avoiding the genitals. Your right leg and foot. Your left leg and foot. The back of your neck, your back, and your buttocks. Do not rinse. Discard the cloth and let the area air-dry. Do not put any substances on your body afterward--such as powder, lotion, or perfume--unless you are told to do so by your health care provider. Only use lotions that are recommended by the manufacturer. Put on clean clothes or pajamas. Contact a health care provider if: Your skin gets irritated after scrubbing. You have questions about using your solution or cloth. You swallow any chlorhexidine. Call your local poison control center ([phone] in the [place] Get help right away if: Your eyes itch badly, or they become very red or swollen. Your skin itches badly and is red or swollen. Your hearing changes. You have trouble seeing. You have swelling or  tingling in your mouth or throat. You have trouble breathing. These symptoms may represent a serious problem that is an emergency. Do not wait to see if the symptoms will go away. Get medical help right away. Call your local emergency services (911 in the [place] Do not drive yourself to the hospital. Summary Chlorhexidine gluconate (CHG) is a germ-killing (antiseptic) solution that is used to clean the skin. Cleaning your skin with CHG may help to lower your risk for infection. You may be given CHG to use for bathing. It may be in a bottle or in a prepackaged cloth to use on your skin. Carefully follow your health care provider's instructions and the instructions on the product label. Do not use CHG if you have a chlorhexidine allergy. Contact your health care provider if your skin gets irritated after scrubbing. This information is not intended to replace advice given to you by your health care provider. Make sure you discuss any questions you have with your health care provider. Document Revised: 12/19/2020 Document Reviewed: 12/19/2020 Elsevier Patient Education  2023 [employer] Toe Deformity Repair, Care After This sheet gives you information about how to care for yourself after your procedure. Your health care provider may also give you more specific instructions. If you have problems or questions, contact your health care provider. What can I expect after the procedure? After the procedure, it is common to have: Pain in the affected area. Discomfort with walking. Follow these instructions at home: If you have a postoperative shoe:  Wear the shoe as told by your health care provider. Remove it only as told by your health care provider. Loosen the shoe if your toes tingle, become numb, or turn cold and blue. Keep the shoe clean and dry. Bathing Do not take baths, swim, or use a hot tub until your health care provider approves. Ask your health care provider if you can take showers. You  may only be allowed to take sponge baths. If your postoperative shoe is not waterproof, cover it with a watertight covering when you take a bath or a shower. Keep the bandage (dressing) dry until your health care provider says it can be removed. Incision care  Follow instructions from your health  care provider about how to take care of your incision. Make sure you: Wash your hands with soap and water for at least 20 seconds before and after you change your dressing. If soap and water are not available, use hand sanitizer. Change your dressing as told by your health care provider. Leave stitches (sutures), skin glue, or adhesive strips in place. These skin closures may need to stay in place for 2 weeks or longer. If adhesive strip edges start to loosen and curl up, you may trim the loose edges. Do not remove adhesive strips completely unless your health care provider tells you to do that. Check your incision area every day for signs of infection. Check for: More redness, swelling, or pain. Fluid or blood. Warmth. Pus or a bad smell. Managing pain, stiffness, and swelling  If directed, put ice on the affected area. To do this: Put ice in a plastic bag. Place a towel between your skin and the bag. Leave the ice on for 20 minutes, 2-3 times a day. Move your toes often to avoid stiffness and to lessen swelling. Raise (elevate) the affected foot above the level of your heart while you are sitting or lying down. Driving Ask your health care provider if the medicine prescribed to you requires you to avoid driving or using machinery. Ask your health care provider when it is safe to drive if you have a postoperative shoe on your foot. Activity Walk and return to your normal activities as told by your health care provider. Ask your health care provider what activities are safe for you. Do not use your affected foot to support your body weight until your health care provider says that you can. Use  crutches as directed by your health care provider. Do exercises as told by your health care provider or physical therapist. General instructions Take over-the-counter and prescription medicines only as told by your health care provider. Ask your health care provider if the medicine prescribed to you can cause constipation. You may need to take these actions to prevent or treat constipation: Drink enough fluid to keep your urine pale yellow. Take over-the-counter or prescription medicines. Eat foods that are high in fiber, such as beans, whole grains, and fresh fruits and vegetables. Limit foods that are high in fat and processed sugars, such as fried or sweet foods. Do not use any products that contain nicotine or tobacco, such as cigarettes, e-cigarettes, and chewing tobacco. These can delay bone healing. If you need help quitting, ask your health care provider. Keep all follow-up visits. This is important. Contact a health care provider if: You have more redness, swelling, or pain at your incision site. You notice redness extending from the surgical site upward. You have fluid or blood coming from your incision. Your incision feels warm to the touch. You have pus or a bad smell coming from the incision area or the dressing. Your leg swells. You have a fever. Get help right away if: You develop a rash. You have chest pain or difficulty breathing. These symptoms may represent a serious problem that is an emergency. Do not wait to see if the symptoms will go away. Get medical help right away. Call your local emergency services (911 in the [place] Do not drive yourself to the hospital. Summary After the procedure, it is common to have pain in the affected area and discomfort with walking. Follow instructions from your health care provider about how to take care of your incision. Do not use  your affected foot to support your body weight until your health care provider says that you can. Use  crutches as directed by your health care provider. Walk and return to your normal activities as told by your health care provider. Ask your health care provider what activities are safe for you. Keep all follow-up visits as told by your health care provider. This information is not intended to replace advice given to you by your health care provider. Make sure you discuss any questions you have with your health care provider. Document Revised: 01/14/2020 Document Reviewed: 01/14/2020 Elsevier Patient Education  2023 Else[employer] Inc. Monitored Anesthesia Care, Care After This sheet gives you information about how to care for yourself after your procedure. Your health care provider may also give you more specific instructions. If you have problems or questions, contact your health care provider. What can I expect after the procedure? After the procedure, it is common to have: Tiredness. Forgetfulness about what happened after the procedure. Impaired judgment for important decisions. Nausea or vomiting. Some difficulty with balance. Follow these instructions at home: For the time period you were told by your health care provider:     Rest as needed. Do not participate in activities where you could fall or become injured. Do not drive or use machinery. Do not drink alcohol. Do not take sleeping pills or medicines that cause drowsiness. Do not make important decisions or sign legal documents. Do not take care of children on your own. Eating and drinking Follow the diet that is recommended by your health care provider. Drink enough fluid to keep your urine pale yellow. If you vomit: Drink water, juice, or soup when you can drink without vomiting. Make sure you have little or no nausea before eating solid foods. General instructions Have a responsible adult stay with you for the time you are told. It is important to have someone help care for you until you are awake and alert. Take  over-the-counter and prescription medicines only as told by your health care provider. If you have sleep apnea, surgery and certain medicines can increase your risk for breathing problems. Follow instructions from your health care provider about wearing your sleep device: Anytime you are sleeping, including during daytime naps. While taking prescription pain medicines, sleeping medicines, or medicines that make you drowsy. Avoid smoking. Keep all follow-up visits as told by your health care provider. This is important. Contact a health care provider if: You keep feeling nauseous or you keep vomiting. You feel light-headed. You are still sleepy or having trouble with balance after 24 hours. You develop a rash. You have a fever. You have redness or swelling around the IV site. Get help right away if: You have trouble breathing. You have new-onset confusion at home. Summary For several hours after your procedure, you may feel tired. You may also be forgetful and have poor judgment. Have a responsible adult stay with you for the time you are told. It is important to have someone help care for you until you are awake and alert. Rest as told. Do not drive or operate machinery. Do not drink alcohol or take sleeping pills. Get help right away if you have trouble breathing, or if you suddenly become confused. This information is not intended to replace advice given to you by your health care provider. Make sure you discuss any questions you have with your health care provider. Document Revised: 09/12/2021 Document Reviewed: 09/10/2019 Elsevier Patient Education  2023 [employer].

## 2022-04-12 NOTE — Procedures (Signed)
    History:  31 year old woman with episode of dejavu and syncope   EEG classification: Awake and drowsy  Description of the recording: The background rhythms of this recording consists of a fairly well modulated medium amplitude alpha rhythm of 11-12 Hz that is reactive to eye opening and closure. As the record progresses, the patient appears to remain in the waking state throughout the recording. Photic stimulation was performed, did not show any abnormalities. Hyperventilation was also performed, did not show any abnormalities. Toward the end of the recording, the patient enters the drowsy state with slight symmetric slowing seen. The patient never enters stage II sleep. No abnormal epileptiform discharges seen during this recording. There was no focal slowing. EKG monitor shows no evidence of cardiac rhythm abnormalities with a heart rate of 66.  Abnormality: None   Impression: This is a normal EEG recording in the waking and drowsy state. No evidence of interictal epileptiform discharges seen. A normal EEG does not exclude a diagnosis of epilepsy.    Windell Norfolk, MD [facility]

## 2022-04-16 ENCOUNTER — Encounter (HOSPITAL_COMMUNITY)
Admission: RE | Admit: 2022-04-16 | Discharge: 2022-04-16 | Disposition: A | Discharge status: Home / Self Care | Payer: BC Managed Care – PPO | Source: Ambulatory Visit | Attending: Podiatry | Admitting: Podiatry

## 2022-04-16 ENCOUNTER — Other Ambulatory Visit (HOSPITAL_COMMUNITY): Payer: Self-pay | Admitting: Podiatry

## 2022-04-16 ENCOUNTER — Encounter (HOSPITAL_COMMUNITY): Payer: Self-pay

## 2022-04-16 ENCOUNTER — Ambulatory Visit (HOSPITAL_COMMUNITY)
Admission: RE | Admit: 2022-04-16 | Discharge: 2022-04-16 | Disposition: A | Discharge status: Home / Self Care | Payer: BC Managed Care – PPO | Source: Ambulatory Visit | Attending: Podiatry | Admitting: Podiatry

## 2022-04-16 VITALS — BP 133/88 | HR 55 | Temp 97.7°F | Resp 20 | Ht 66.0 in | Wt 225.0 lb

## 2022-04-16 DIAGNOSIS — O98419 Viral hepatitis complicating pregnancy, unspecified trimester: Secondary | ICD-10-CM | POA: Insufficient documentation

## 2022-04-16 DIAGNOSIS — Z01818 Encounter for other preprocedural examination: Secondary | ICD-10-CM

## 2022-04-16 DIAGNOSIS — M205X1 Other deformities of toe(s) (acquired), right foot: Secondary | ICD-10-CM | POA: Diagnosis present

## 2022-04-16 DIAGNOSIS — B182 Chronic viral hepatitis C: Secondary | ICD-10-CM

## 2022-04-16 LAB — POCT PREGNANCY, URINE: Preg Test, Ur: NEGATIVE

## 2022-04-16 LAB — COMPREHENSIVE METABOLIC PANEL
ALT: 22 U/L (ref 0–44)
AST: 20 U/L (ref 15–41)
Albumin: 4.3 g/dL (ref 3.5–5.0)
Alkaline Phosphatase: 81 U/L (ref 38–126)
Anion gap: 6 (ref 5–15)
BUN: 11 mg/dL (ref 6–20)
CO2: 25 mmol/L (ref 22–32)
Calcium: 9.5 mg/dL (ref 8.9–10.3)
Chloride: 105 mmol/L (ref 98–111)
Creatinine, Ser: 0.73 mg/dL (ref 0.44–1.00)
GFR, Estimated: >60 mL/min (ref >60)
Glucose, Bld: 74 mg/dL (ref 70–99)
Potassium: 3.8 mmol/L (ref 3.5–5.1)
Sodium: 136 mmol/L (ref 135–145)
Total Bilirubin: 1 mg/dL (ref 0.3–1.2)
Total Protein: 7.9 g/dL (ref 6.5–8.1)

## 2022-04-16 LAB — APTT: aPTT: 31 seconds (ref 24–36)

## 2022-04-16 LAB — PROTIME-INR
INR: 1 (ref 0.8–1.2)
Prothrombin Time: 12.7 seconds (ref 11.4–15.2)

## 2022-04-18 ENCOUNTER — Other Ambulatory Visit: Payer: Self-pay

## 2022-04-18 ENCOUNTER — Ambulatory Visit (HOSPITAL_COMMUNITY): Payer: BC Managed Care – PPO | Admitting: Certified Registered Nurse Anesthetist

## 2022-04-18 ENCOUNTER — Ambulatory Visit (HOSPITAL_COMMUNITY)
Admission: RE | Admit: 2022-04-18 | Discharge: 2022-04-18 | Disposition: A | Discharge status: Home / Self Care | Payer: BC Managed Care – PPO | Source: Ambulatory Visit | Attending: Podiatry | Admitting: Podiatry

## 2022-04-18 ENCOUNTER — Encounter (HOSPITAL_COMMUNITY): Payer: Self-pay

## 2022-04-18 ENCOUNTER — Ambulatory Visit (HOSPITAL_COMMUNITY): Payer: BC Managed Care – PPO

## 2022-04-18 ENCOUNTER — Encounter (HOSPITAL_COMMUNITY)
Admission: RE | Disposition: A | Discharge status: Home / Self Care | Payer: Self-pay | Source: Ambulatory Visit | Attending: Podiatry

## 2022-04-18 DIAGNOSIS — Z6836 Body mass index (BMI) 36.0-36.9, adult: Secondary | ICD-10-CM | POA: Insufficient documentation

## 2022-04-18 DIAGNOSIS — I10 Essential (primary) hypertension: Secondary | ICD-10-CM | POA: Insufficient documentation

## 2022-04-18 DIAGNOSIS — B182 Chronic viral hepatitis C: Secondary | ICD-10-CM | POA: Insufficient documentation

## 2022-04-18 DIAGNOSIS — M205X1 Other deformities of toe(s) (acquired), right foot: Secondary | ICD-10-CM | POA: Insufficient documentation

## 2022-04-18 DIAGNOSIS — Z87891 Personal history of nicotine dependence: Secondary | ICD-10-CM | POA: Diagnosis not present

## 2022-04-18 HISTORY — PX: CHEILECTOMY: SHX1336

## 2022-04-18 SURGERY — CHEILECTOMY
Anesthesia: General | Site: Foot | Laterality: Right

## 2022-04-18 MED ORDER — CEFAZOLIN SODIUM-DEXTROSE 2-4 GM/100ML-% IV SOLN
INTRAVENOUS | Status: AC
  Filled 2022-04-18: qty 100

## 2022-04-18 MED ORDER — PROPOFOL 500 MG/50ML IV EMUL
INTRAVENOUS | Status: DC | PRN
  Administered 2022-04-18: 125 ug/kg/min via INTRAVENOUS

## 2022-04-18 MED ORDER — FENTANYL CITRATE PF 50 MCG/ML IJ SOSY
25.0000 ug | PREFILLED_SYRINGE | INTRAMUSCULAR | Status: DC | PRN
  Administered 2022-04-18: 50 ug via INTRAVENOUS
  Filled 2022-04-18: qty 1

## 2022-04-18 MED ORDER — CHLORHEXIDINE GLUCONATE CLOTH 2 % EX PADS: 6.0000 | MEDICATED_PAD | Freq: Once | CUTANEOUS | Status: DC

## 2022-04-18 MED ORDER — ONDANSETRON HCL 4 MG/2ML IJ SOLN: 4.0000 mg | Freq: Once | INTRAMUSCULAR | Status: DC | PRN

## 2022-04-18 MED ORDER — BUPIVACAINE HCL (PF) 0.5 % IJ SOLN
INTRAMUSCULAR | Status: AC
  Filled 2022-04-18: qty 30

## 2022-04-18 MED ORDER — CEFAZOLIN SODIUM-DEXTROSE 2-4 GM/100ML-% IV SOLN
2.0000 g | INTRAVENOUS | Status: AC
  Administered 2022-04-18: 2 g via INTRAVENOUS

## 2022-04-18 MED ORDER — 0.9 % SODIUM CHLORIDE (POUR BTL) OPTIME
TOPICAL | Status: DC | PRN
  Administered 2022-04-18: 1000 mL

## 2022-04-18 MED ORDER — LIDOCAINE HCL (PF) 1 % IJ SOLN
INTRAMUSCULAR | Status: AC
  Filled 2022-04-18: qty 30

## 2022-04-18 MED ORDER — FENTANYL CITRATE (PF) 100 MCG/2ML IJ SOLN
INTRAMUSCULAR | Status: DC | PRN
  Administered 2022-04-18 (×2): 50 ug via INTRAVENOUS

## 2022-04-18 MED ORDER — PROPOFOL 10 MG/ML IV BOLUS
INTRAVENOUS | Status: AC
  Filled 2022-04-18: qty 20

## 2022-04-18 MED ORDER — DEXMEDETOMIDINE (PRECEDEX) IN NS 20 MCG/5ML (4 MCG/ML) IV SYRINGE
PREFILLED_SYRINGE | INTRAVENOUS | Status: DC | PRN
  Administered 2022-04-18: 8 ug via INTRAVENOUS

## 2022-04-18 MED ORDER — LACTATED RINGERS IV SOLN: INTRAVENOUS | Status: DC

## 2022-04-18 MED ORDER — PROPOFOL 10 MG/ML IV BOLUS
INTRAVENOUS | Status: DC | PRN
  Administered 2022-04-18: 40 mg via INTRAVENOUS

## 2022-04-18 MED ORDER — FENTANYL CITRATE (PF) 100 MCG/2ML IJ SOLN
INTRAMUSCULAR | Status: AC
  Filled 2022-04-18: qty 2

## 2022-04-18 MED ORDER — DEXMEDETOMIDINE HCL IN NACL 80 MCG/20ML IV SOLN
INTRAVENOUS | Status: AC
  Filled 2022-04-18: qty 20

## 2022-04-18 MED ORDER — MIDAZOLAM HCL 2 MG/2ML IJ SOLN
INTRAMUSCULAR | Status: AC
  Filled 2022-04-18: qty 2

## 2022-04-18 MED ORDER — CHLORHEXIDINE GLUCONATE 0.12 % MT SOLN: 15.0000 mL | Freq: Once | OROMUCOSAL | Status: DC

## 2022-04-18 MED ORDER — LIDOCAINE HCL 1 % IJ SOLN
INTRAMUSCULAR | Status: DC | PRN
  Administered 2022-04-18 (×2): 10 mL via INTRAMUSCULAR

## 2022-04-18 MED ORDER — PHENYLEPHRINE 80 MCG/ML (10ML) SYRINGE FOR IV PUSH (FOR BLOOD PRESSURE SUPPORT)
PREFILLED_SYRINGE | INTRAVENOUS | Status: AC
  Filled 2022-04-18: qty 20

## 2022-04-18 MED ORDER — LIDOCAINE HCL (CARDIAC) PF 100 MG/5ML IV SOSY
PREFILLED_SYRINGE | INTRAVENOUS | Status: DC | PRN
  Administered 2022-04-18: 60 mg via INTRATRACHEAL

## 2022-04-18 MED ORDER — MIDAZOLAM HCL 2 MG/2ML IJ SOLN
INTRAMUSCULAR | Status: DC | PRN
  Administered 2022-04-18: 2 mg via INTRAVENOUS

## 2022-04-18 MED ORDER — ORAL CARE MOUTH RINSE: 15.0000 mL | Freq: Once | OROMUCOSAL | Status: DC

## 2022-04-18 SURGICAL SUPPLY — 48 items
APL PRP STRL LF DISP 70% ISPRP (MISCELLANEOUS) ×1
APL SKNCLS STERI-STRIP NONHPOA (GAUZE/BANDAGES/DRESSINGS) ×1
BANDAGE ESMARK 4X12 BL STRL LF (DISPOSABLE) ×1 IMPLANT
BENZOIN TINCTURE PRP APPL 2/3 (GAUZE/BANDAGES/DRESSINGS) ×2 IMPLANT
BLADE AVERAGE 25X9 (BLADE) ×2 IMPLANT
BLADE SURG 15 STRL LF DISP TIS (BLADE) ×1 IMPLANT
BLADE SURG 15 STRL SS (BLADE) ×2
BNDG CMPR 12X4 ELC STRL LF (DISPOSABLE) ×1
BNDG CMPR STD VLCR NS LF 5.8X4 (GAUZE/BANDAGES/DRESSINGS) ×1
BNDG CONFORM 2 STRL LF (GAUZE/BANDAGES/DRESSINGS) ×2 IMPLANT
BNDG ELASTIC 4X5.8 VLCR NS LF (GAUZE/BANDAGES/DRESSINGS) ×2 IMPLANT
BNDG ESMARK 4X12 BLUE STRL LF (DISPOSABLE) ×2
CHLORAPREP W/TINT 26 (MISCELLANEOUS) ×2 IMPLANT
CLOTH BEACON ORANGE TIMEOUT ST (SAFETY) ×2 IMPLANT
COVER LIGHT HANDLE STERIS (MISCELLANEOUS) ×4 IMPLANT
CUFF TOURN SGL QUICK 18 (TOURNIQUET CUFF) ×1 IMPLANT
DECANTER SPIKE VIAL GLASS SM (MISCELLANEOUS) ×4 IMPLANT
DRAPE OEC MINIVIEW 54X84 (DRAPES) ×2 IMPLANT
DRSG ADAPTIC 3X8 NADH LF (GAUZE/BANDAGES/DRESSINGS) ×2 IMPLANT
ELECT REM PT RETURN 9FT ADLT (ELECTROSURGICAL) ×2
ELECTRODE REM PT RTRN 9FT ADLT (ELECTROSURGICAL) ×1 IMPLANT
GAUZE SPONGE 4X4 12PLY STRL (GAUZE/BANDAGES/DRESSINGS) ×2 IMPLANT
GLOVE BIO SURGEON STRL SZ7.5 (GLOVE) ×2 IMPLANT
GLOVE BIOGEL PI IND STRL 7.0 (GLOVE) ×2 IMPLANT
GLOVE BIOGEL PI IND STRL 7.5 (GLOVE) ×1 IMPLANT
GLOVE BIOGEL PI INDICATOR 7.0 (GLOVE) ×2
GLOVE BIOGEL PI INDICATOR 7.5 (GLOVE) ×1
GLOVE ECLIPSE 7.0 STRL STRAW (GLOVE) ×2 IMPLANT
GOWN STRL REUS W/ TWL LRG LVL3 (GOWN DISPOSABLE) ×1 IMPLANT
GOWN STRL REUS W/TWL LRG LVL3 (GOWN DISPOSABLE) ×6 IMPLANT
KIT TURNOVER KIT A (KITS) ×2 IMPLANT
MANIFOLD NEPTUNE II (INSTRUMENTS) ×2 IMPLANT
NDL HYPO 18GX1.5 BLUNT FILL (NEEDLE) ×1 IMPLANT
NDL HYPO 25X1 1.5 SAFETY (NEEDLE) ×5 IMPLANT
NEEDLE HYPO 18GX1.5 BLUNT FILL (NEEDLE) ×2 IMPLANT
NEEDLE HYPO 25X1 1.5 SAFETY (NEEDLE) ×4 IMPLANT
NS IRRIG 1000ML POUR BTL (IV SOLUTION) ×2 IMPLANT
PACK BASIC LIMB (CUSTOM PROCEDURE TRAY) ×2 IMPLANT
PAD ARMBOARD 7.5X6 YLW CONV (MISCELLANEOUS) ×2 IMPLANT
RASP SM TEAR CROSS CUT (RASP) ×1 IMPLANT
SET BASIN LINEN APH (SET/KITS/TRAYS/PACK) ×2 IMPLANT
SPONGE T-LAP 18X18 ~~LOC~~+RFID (SPONGE) ×1 IMPLANT
STRIP CLOSURE SKIN 1/2X4 (GAUZE/BANDAGES/DRESSINGS) ×4 IMPLANT
SUT PROLENE 4 0 PS 2 18 (SUTURE) ×1 IMPLANT
SUT VIC AB 2-0 CT2 27 (SUTURE) ×1 IMPLANT
SUT VIC AB 4-0 PS2 27 (SUTURE) ×1 IMPLANT
SUT VICRYL AB 3-0 FS1 BRD 27IN (SUTURE) ×1 IMPLANT
SYR CONTROL 10ML LL (SYRINGE) ×6 IMPLANT

## 2022-04-18 NOTE — H&P (Signed)
.  HISTORY AND PHYSICAL INTERVAL NOTE:  04/18/2022  10:55 AM  Kelsey Craig  has presented today for surgery, with the diagnosis of RIGHT HALLUX LIMITUS RIGHT TOE PAIN.  The various methods of treatment have been discussed with the patient.  No guarantees were given.  After consideration of risks, benefits and other options for treatment, the patient has consented to surgery.  I have reviewed the patients' chart and labs.    Patient Vitals for the past 24 hrs:  BP Temp Temp src Pulse Resp SpO2 Height Weight  04/18/22 0927 135/82 98.6 F (37 C) Oral 95 20 (!) 20 % 5\' 6"  (1.676 m) 102.1 kg    A history and physical examination was performed in my office.  The patient was reexamined.  There have been no changes to this history and physical examination.   Elvena Oyer, Emyah Roznowski DPM

## 2022-04-18 NOTE — Brief Op Note (Signed)
04/18/2022  12:15 PM  PATIENT:  Kelsey Craig  31 y.o. female  PRE-OPERATIVE DIAGNOSIS:  RIGHT HALLUX LIMITUS RIGHT TOE PAIN  POST-OPERATIVE DIAGNOSIS:  RIGHT HALLUX LIMITUS RIGHT TOE PAIN  PROCEDURE:  Procedure(s): CHEILECTOMY RIGHT HALLUX (Right)  SURGEON:  Surgeon(s) and Role:    * Hershy Flenner, Layla Barter, DPM - Primary  PHYSICIAN ASSISTANT:   ASSISTANTS: none   ANESTHESIA:   local and MAC  EBL:  1 mL   BLOOD ADMINISTERED:none  DRAINS: none   LOCAL MEDICATIONS USED:  MARCAINE   , LIDOCAINE , and Amount: 10 ml pre op and 10 ml post op.   SPECIMEN:  No Specimen  DISPOSITION OF SPECIMEN:  N/A  COUNTS:  YES  TOURNIQUET:   Total Tourniquet Time Documented: Calf (Right) - 52 minutes Total: Calf (Right) - 52 minutes   DICTATION: .Viviann Spare Dictation  PLAN OF CARE: Discharge to home after PACU  PATIENT DISPOSITION:  PACU - hemodynamically stable.   Delay start of Pharmacological VTE agent (>24hrs) due to surgical blood loss or risk of bleeding: not applicable

## 2022-04-18 NOTE — Anesthesia Procedure Notes (Signed)
Date/Time: 04/18/2022 11:17 AM  Performed by: Lorin Glass, CRNAPre-anesthesia Checklist: Patient identified, Emergency Drugs available, Suction available and Patient being monitored Oxygen Delivery Method: Nasal cannula

## 2022-04-18 NOTE — Discharge Instructions (Signed)

## 2022-04-18 NOTE — Op Note (Signed)
04/18/2022  12:15 PM  PATIENT:  Kelsey Craig  31 y.o. female  PRE-OPERATIVE DIAGNOSIS:  RIGHT HALLUX LIMITUS RIGHT TOE PAIN  POST-OPERATIVE DIAGNOSIS:  RIGHT HALLUX LIMITUS RIGHT TOE PAIN  PROCEDURE:  Procedure(s): CHEILECTOMY RIGHT HALLUX (Right)  SURGEON:  Surgeon(s) and Role:    * Destinie Thornsberry, Layla Barter, DPM - Primary  PHYSICIAN ASSISTANT:   ASSISTANTS: none   ANESTHESIA:   local and MAC  EBL:  1 mL   Materials used: 2-0 Vicryl, 3-0 Vicryl, 4-0 Vicryl, 3-0 Prolene   LOCAL MEDICATIONS USED:  MARCAINE   , LIDOCAINE , and Amount: 10 ml pre op and 10 ml post op.   TOURNIQUET:   Total Tourniquet Time Documented: Calf (Right) - 52 minutes Total: Calf (Right) - 52 minutes  Patient was brought into the operating room laid supine on the operating table. Ankle tourniquet was applied to the surgical extremity. Following IV sedation, a local block was achieved using 10 cc of mixture of 1% plain lidocaine with 0.5% marcaine. The foot was the prepped, scrubbed and draped in aseptic manner. Using an esmarch band the tourniquet on the surgical site was inflatted at 235mHG.    Attention was then directed to the dorsomedial aspect of the right first metatarsophalangeal joint where a 6-cm linear incision was made medial and parallel to the course of the extensor hallucis longus tendon.  The incision was deepened through subcutaneous tissues.  Vital neurovascular structures were identified and retracted.  All bleeders were identified and cauterized.  The incision was deepened to the level of the first metatarsophalangeal joint capsule.  An inverted L-type capsulotomy was performed over the dorsal aspect of the first metatarsophalangeal joint.  The capsular and periosteal structures were dissected free of their osseous attachments and reflected medially and laterally thus exposing the head of the first metatarsal at the operative site.  There was significant amount of osteophyte noted on the  dorsal aspect of the first metatarsal head. Utilizing a sagittal saw, the medial and dorsal prominence was resected and passed from the operative field.  All rough edges were smoothed.  The area was copiously irrigated. Medial capsulorrhaphy was performed using a sharp 15 blade and 1-2 pick up. The periosteal and capsular tissues were approximated with 2-0 Vicryl suture. It was noted that the toe was sitting in better position after the capsulorrhaphy.  3-0 Vicryl was used to approximate the subcutaneous tissues. 4-0 Vicryl was used to approximate the skin in a subcuticular manner.     Dry sterile dressing applied. The tourniquet was deflated. Capillary refill time was brisk to lesser toes. Patient was transported to PACU with VSS.

## 2022-04-18 NOTE — Transfer of Care (Signed)
Immediate Anesthesia Transfer of Care Note  Patient: Kelsey Craig  Procedure(s) Performed: CHEILECTOMY RIGHT HALLUX (Right: Foot)  Patient Location: Short Stay  Anesthesia Type:General  Level of Consciousness: awake, alert  and oriented  Airway & Oxygen Therapy: Patient Spontanous Breathing  Post-op Assessment: Report given to RN and Post -op Vital signs reviewed and stable  Post vital signs: Reviewed and stable  Last Vitals:  Vitals Value Taken Time  BP    Temp    Pulse    Resp    SpO2      Last Pain:  Vitals:   04/18/22 0927  TempSrc: Oral  PainSc: 0-No pain         Complications: No notable events documented.

## 2022-04-18 NOTE — Anesthesia Preprocedure Evaluation (Signed)
Anesthesia Evaluation  Patient identified by MRN, date of birth, ID band Patient awake    Reviewed: Allergy & Precautions, H&P , NPO status , Patient's Chart, lab work & pertinent test results, reviewed documented beta blocker date and time   Airway Mallampati: II  TM Distance: >3 FB Neck ROM: full    Dental no notable dental hx.    Pulmonary neg pulmonary ROS, former smoker,    Pulmonary exam normal breath sounds clear to auscultation       Cardiovascular Exercise Tolerance: Good hypertension,  Rhythm:regular Rate:Normal     Neuro/Psych negative neurological ROS  negative psych ROS   GI/Hepatic negative GI ROS, (+) Hepatitis -, C  Endo/Other  Morbid obesity  Renal/GU negative Renal ROS  negative genitourinary   Musculoskeletal   Abdominal   Peds  Hematology negative hematology ROS (+)   Anesthesia Other Findings   Reproductive/Obstetrics negative OB ROS                             Anesthesia Physical Anesthesia Plan  ASA: 3  Anesthesia Plan: General   Post-op Pain Management:    Induction:   PONV Risk Score and Plan: Propofol infusion  Airway Management Planned:   Additional Equipment:   Intra-op Plan:   Post-operative Plan:   Informed Consent: I have reviewed the patients History and Physical, chart, labs and discussed the procedure including the risks, benefits and alternatives for the proposed anesthesia with the patient or authorized representative who has indicated his/her understanding and acceptance.     Dental Advisory Given  Plan Discussed with: CRNA  Anesthesia Plan Comments:         Anesthesia Quick Evaluation

## 2022-04-20 ENCOUNTER — Encounter (HOSPITAL_COMMUNITY): Payer: Self-pay | Admitting: Podiatry

## 2022-04-20 NOTE — Anesthesia Postprocedure Evaluation (Signed)
Anesthesia Post Note  Patient: Kelsey Craig  Procedure(s) Performed: CHEILECTOMY RIGHT HALLUX (Right: Foot)  Patient location during evaluation: Phase II Anesthesia Type: General Level of consciousness: awake Pain management: pain level controlled Vital Signs Assessment: post-procedure vital signs reviewed and stable Respiratory status: spontaneous breathing and respiratory function stable Cardiovascular status: blood pressure returned to baseline and stable Postop Assessment: no headache and no apparent nausea or vomiting Anesthetic complications: no Comments: Late entry   No notable events documented.   Last Vitals:  Vitals:   04/18/22 0927 04/18/22 1218  BP: 135/82 105/75  Pulse: 95 77  Resp: 20 13  Temp: 37 C 36.4 C  SpO2: (!) 20% 99%    Last Pain:  Vitals:   04/19/22 1057  TempSrc:   PainSc: 8                  Windell Norfolk

## 2022-10-01 ENCOUNTER — Ambulatory Visit: Payer: BC Managed Care – PPO | Admitting: Psychiatry

## 2022-10-24 ENCOUNTER — Ambulatory Visit
Admission: EM | Admit: 2022-10-24 | Discharge: 2022-10-24 | Disposition: A | Discharge status: Home / Self Care | Payer: BC Managed Care – PPO

## 2022-10-24 DIAGNOSIS — G43809 Other migraine, not intractable, without status migrainosus: Secondary | ICD-10-CM | POA: Diagnosis not present

## 2022-10-24 MED ORDER — KETOROLAC TROMETHAMINE 60 MG/2ML IM SOLN
60.0000 mg | Freq: Once | INTRAMUSCULAR | Status: AC
  Administered 2022-10-24: 60 mg via INTRAMUSCULAR

## 2022-10-24 MED ORDER — METOCLOPRAMIDE HCL 5 MG/ML IJ SOLN: 10.0000 mg | Freq: Once | INTRAMUSCULAR | Status: DC

## 2022-10-24 MED ORDER — METOCLOPRAMIDE HCL 5 MG/ML IJ SOLN
10.0000 mg | Freq: Once | INTRAMUSCULAR | Status: AC
  Administered 2022-10-24: 10 mg via INTRAMUSCULAR

## 2022-10-24 MED ORDER — DIPHENHYDRAMINE HCL 50 MG/ML IJ SOLN
25.0000 mg | Freq: Once | INTRAMUSCULAR | Status: AC
  Administered 2022-10-24: 25 mg via INTRAMUSCULAR

## 2022-10-24 NOTE — ED Provider Notes (Signed)
[facility]    CSN: [id number] Arrival date & time: 10/24/22  1708      History   Chief Complaint No chief complaint on file.   HPI Kelsey Craig is a 32 y.o. female.   Complains of a migraine headache patient reports using over-the-counter medicines at home without relief of her symptoms.  Patient reports she has been vomiting she has not been able to keep any fluids down.  Patient has been treated in the past with Imitrex injections as well as migraine cocktail.  Patient denies any neurologic changes she has not had any vision changes no hearing changes she denies any weakness.  Patient has not had any cough or fever  The history is provided by the patient. No language interpreter was used.    Past Medical History:  Diagnosis Date   Hepatitis C    History of alcohol abuse    History of intravenous drug abuse ([place])    Migraine     Patient Active Problem List   Diagnosis Date Noted   Gestational hypertension 09/29/2019   Encounter for planned induction of labor 09/28/2019   SVD  03/29/2018   Postpartum care following vaginal delivery (12/8) 03/29/2018   Second-degree perineal laceration, with delivery 03/29/2018   Chronic hepatitis C affecting pregnancy, antepartum ([place]) 02/20/2018    Past Surgical History:  Procedure Laterality Date   BREAST MASS EXCISION     BREAST SURGERY     CHEILECTOMY Right 04/18/2022   Procedure: CHEILECTOMY RIGHT HALLUX;  Surgeon: Tyson Babinski, DPM;  Location: [facility];  Service: Podiatry;  Laterality: Right;   TONSILLECTOMY      OB History     Gravida  2   Para  2   Term  2   Preterm      AB      Living  2      SAB      IAB      Ectopic      Multiple  0   Live Births  2            Home Medications    Prior to Admission medications   Medication Sig Start Date End Date Taking? Authorizing Provider  fluticasone (FLONASE) 50 MCG/ACT nasal spray Place 1 spray into both nostrils daily as  needed for allergies or rhinitis.    [provider]  Galcanezumab-gnlm (EMGALITY) 120 MG/ML SOAJ Inject 120 mg into the skin every 30 (thirty) days. 04/02/22   Genia Harold, MD  ibuprofen (ADVIL) 600 MG tablet Take 1 tablet (600 mg total) by mouth every 6 (six) hours as needed. Patient not taking: Reported on 04/09/2022 03/24/21   Melynda Ripple, MD  loratadine (CLARITIN) 10 MG tablet Take 10 mg by mouth daily.    [provider]  Multiple Vitamin (MULTIVITAMIN WITH MINERALS) TABS tablet Take 1 tablet by mouth daily.    [provider]  rizatriptan (MAXALT) 10 MG tablet Take 1 tablet (10 mg total) by mouth as needed for migraine. May repeat in 2 hours if needed 04/02/22   Genia Harold, MD  Ubrogepant (UBRELVY) 50 MG TABS Take 1 tablet by mouth daily as needed.    [provider]    Family History Family History  Problem Relation Age of Onset   Hypertension Mother    Diabetes Father     Social History Social History   Tobacco Use   Smoking status: Former   Smokeless tobacco: Never  Media planner  Vaping Use: Never used  Substance Use Topics   Alcohol use: Yes   Drug use: Not Currently    Comment: hx IV drug use and alcohol abuse     Allergies   Latex   Review of Systems Review of Systems  All other systems reviewed and are negative.    Physical Exam Triage Vital Signs ED Triage Vitals  Enc Vitals Group     BP 10/24/22 1735 (!) 144/84     Pulse Rate 10/24/22 1735 (!) 123     Resp 10/24/22 1735 20     Temp 10/24/22 1735 98.8 F (37.1 C)     Temp Source 10/24/22 1735 Oral     SpO2 10/24/22 1735 96 %     Weight --      Height --      Head Circumference --      Peak Flow --      Pain Score 10/24/22 1737 7     Pain Loc --      Pain Edu? --      Excl. in [place]? --    No data found.  Updated Vital Signs BP (!) 144/84 (BP Location: Right Arm)   Pulse (!) 123   Temp 98.8 F (37.1 C) (Oral)   Resp 20   LMP 10/18/2022  (Approximate)   SpO2 96%   Visual Acuity Right Eye Distance:   Left Eye Distance:   Bilateral Distance:    Right Eye Near:   Left Eye Near:    Bilateral Near:     Physical Exam Vitals and nursing note reviewed.  Constitutional:      Appearance: She is well-developed.  HENT:     Head: Normocephalic.  Cardiovascular:     Rate and Rhythm: Normal rate.  Pulmonary:     Effort: Pulmonary effort is normal.  Abdominal:     General: There is no distension.  Musculoskeletal:        General: Normal range of motion.     Cervical back: Normal range of motion.  Skin:    General: Skin is warm.  Neurological:     General: No focal deficit present.     Mental Status: She is alert and oriented to person, place, and time.      UC Treatments / Results  Labs (all labs ordered are listed, but only abnormal results are displayed) Labs Reviewed - No data to display  EKG   Radiology No results found.  Procedures Procedures (including critical care time)  Medications Ordered in UC Medications  ketorolac (TORADOL) injection 60 mg (60 mg Intramuscular Given 10/24/22 1815)  diphenhydrAMINE (BENADRYL) injection 25 mg (25 mg Intramuscular Given 10/24/22 1813)  metoCLOPramide (REGLAN) injection 10 mg (10 mg Intramuscular Given 10/24/22 1814)    Initial Impression / Assessment and Plan / UC Course  I have reviewed the triage vital signs and the nursing notes.  Pertinent labs & imaging results that were available during my care of the patient were reviewed by me and considered in my medical decision making (see chart for details).     Patient given migraine cocktail including Toradol Reglan and Benadryl.  Patient is advised to drink plenty of fluids follow-up with her neurologist for recheck return if any problems Final Clinical Impressions(s) / UC Diagnoses   Final diagnoses:  Other migraine without status migrainosus, not intractable   Discharge Instructions   None    ED  Prescriptions   None    PDMP not reviewed this  encounter.   Elson Areas, [place] 10/24/22 1914

## 2022-10-24 NOTE — ED Triage Notes (Signed)
Pt reports she has had a migraine for 24 hrs and a sinus infection. She got a shot x 3 days ago for sinuses.

## 2022-11-16 ENCOUNTER — Other Ambulatory Visit: Payer: Self-pay

## 2022-11-16 ENCOUNTER — Encounter: Payer: Self-pay | Admitting: Allergy & Immunology

## 2022-11-16 ENCOUNTER — Ambulatory Visit: Payer: BC Managed Care – PPO | Admitting: Allergy & Immunology

## 2022-11-16 VITALS — BP 128/70 | HR 100 | Temp 97.9°F | Resp 20 | Ht 66.0 in | Wt 206.0 lb

## 2022-11-16 DIAGNOSIS — R519 Headache, unspecified: Secondary | ICD-10-CM | POA: Diagnosis not present

## 2022-11-16 DIAGNOSIS — J3089 Other allergic rhinitis: Secondary | ICD-10-CM | POA: Diagnosis not present

## 2022-11-16 DIAGNOSIS — J302 Other seasonal allergic rhinitis: Secondary | ICD-10-CM

## 2022-11-16 DIAGNOSIS — R4020 Unspecified coma: Secondary | ICD-10-CM

## 2022-11-16 DIAGNOSIS — R55 Syncope and collapse: Secondary | ICD-10-CM | POA: Diagnosis not present

## 2022-11-16 DIAGNOSIS — L500 Allergic urticaria: Secondary | ICD-10-CM | POA: Diagnosis not present

## 2022-11-16 MED ORDER — RYALTRIS 665-25 MCG/ACT NA SUSP: 2.0000 | Freq: Two times a day (BID) | NASAL | 5 refills | Status: DC | PRN

## 2022-11-16 NOTE — Patient Instructions (Addendum)
1. Seasonal and perennial allergic rhinitis - We will get the full records from [place] first injection is scheduled, but subsequent ones are walk in during the hours that we are open. - Start Ryaltris one spray per nostril twice daily (sample provided). - Continue with cetirizine, but increase to one tablet TWICE daily.  - We might remix your vials to increase the cat and dog and dust mite to make them more efficacious.   2. Allergic urticaria - You seem to have this under control. - We can send in a topical steroid if needed.   3. Syncope, unspecified syncope type - We are going to order a CT angiogram to check out the vasculature of your head and neck. - I am going to send my note to your Neurologist.   4. Return in about 3 months (around 02/15/2023).    Please inform us of any Emergency Department visits, hospitalizations, or changes in symptoms. Call us before going to the ED for breathing or allergy symptoms since we might be able to fit you in for a sick visit. Feel free to contact us anytime with any questions, problems, or concerns.  It was a pleasure to meet you today!  Websites that have reliable patient information: 1. American Academy of Asthma, Allergy, and Immunology: www.aaaai.org 2. Food Allergy Research and Education (FARE): foodallergy.org 3. Mothers of Asthmatics: http://www.asthmacommunitynetwork.org 4. American College of Allergy, Asthma, and Immunology: www.acaai.org   COVID-19 Vaccine Information can be found at: ShippingScam.co.uk For questions related to vaccine distribution or appointments, please email [email or web address] or call [phone].   We realize that you might be concerned about having an allergic reaction to the COVID19 vaccines. To help with that concern, WE ARE OFFERING THE COVID19 VACCINES IN OUR OFFICE! Ask the front desk for dates!     "Like" [place] on Facebook and  Instagram for our latest updates!      A healthy democracy works best when [employer] participate! Make sure you are registered to vote! If you have moved or changed any of your contact information, you will need to get this updated before voting!  In some cases, you MAY be able to register to vote online: [email or web address]      Allergy Shots   Allergies are the result of a chain reaction that starts in the immune system. Your immune system controls how your body defends itself. For instance, if you have an allergy to pollen, your immune system identifies pollen as an invader or allergen. Your immune system overreacts by producing antibodies called Immunoglobulin E (IgE). These antibodies travel to cells that release chemicals, causing an allergic reaction.  The concept behind allergy immunotherapy, whether it is received in the form of shots or tablets, is that the immune system can be desensitized to specific allergens that trigger allergy symptoms. Although it requires time and patience, the payback can be long-term relief.  How Do Allergy Shots Work?  Allergy shots work much like a vaccine. Your body responds to injected amounts of a particular allergen given in increasing doses, eventually developing a resistance and tolerance to it. Allergy shots can lead to decreased, minimal or no allergy symptoms.  There generally are two phases: build-up and maintenance. Build-up often ranges from three to six months and involves receiving injections with increasing amounts of the allergens. The shots are typically given once or twice a week, though more rapid build-up schedules are sometimes used.  The maintenance phase begins when the most effective dose is  reached. This dose is different for each person, depending on how allergic you are and your response to the build-up injections. Once the maintenance dose is reached, there are longer periods between injections,  typically two to four weeks.  Occasionally doctors give cortisone-type shots that can temporarily reduce allergy symptoms. These types of shots are different and should not be confused with allergy immunotherapy shots.  Who Can Be Treated with Allergy Shots?  Allergy shots may be a good treatment approach for people with allergic rhinitis (hay fever), allergic asthma, conjunctivitis (eye allergy) or stinging insect allergy.   Before deciding to begin allergy shots, you should consider:   The length of allergy season and the severity of your symptoms  Whether medications and/or changes to your environment can control your symptoms  Your desire to avoid long-term medication use  Time: allergy immunotherapy requires a major time commitment  Cost: may vary depending on your insurance coverage  Allergy shots for children age 90 and older are effective and often well tolerated. They might prevent the onset of new allergen sensitivities or the progression to asthma.  Allergy shots are not started on patients who are pregnant but can be continued on patients who become pregnant while receiving them. In some patients with other medical conditions or who take certain common medications, allergy shots may be of risk. It is important to mention other medications you talk to your allergist.   When Will I Feel Better?  Some may experience decreased allergy symptoms during the build-up phase. For others, it may take as long as 12 months on the maintenance dose. If there is no improvement after a year of maintenance, your allergist will discuss other treatment options with you.  If you aren't responding to allergy shots, it may be because there is not enough dose of the allergen in your vaccine or there are missing allergens that were not identified during your allergy testing. Other reasons could be that there are high levels of the allergen in your environment or major exposure to non-allergic triggers  like tobacco smoke.  What Is the Length of Treatment?  Once the maintenance dose is reached, allergy shots are generally continued for three to five years. The decision to stop should be discussed with your allergist at that time. Some people may experience a permanent reduction of allergy symptoms. Others may relapse and a longer course of allergy shots can be considered.  What Are the Possible Reactions?  The two types of adverse reactions that can occur with allergy shots are local and systemic. Common local reactions include very mild redness and swelling at the injection site, which can happen immediately or several hours after. A systemic reaction, which is less common, affects the entire body or a particular body system. They are usually mild and typically respond quickly to medications. Signs include increased allergy symptoms such as sneezing, a stuffy nose or hives.  Rarely, a serious systemic reaction called anaphylaxis can develop. Symptoms include swelling in the throat, wheezing, a feeling of tightness in the chest, nausea or dizziness. Most serious systemic reactions develop within 30 minutes of allergy shots. This is why it is strongly recommended you wait in your doctor's office for 30 minutes after your injections. Your allergist is trained to watch for reactions, and his or her staff is trained and equipped with the proper medications to identify and treat them.  Who Should Administer Allergy Shots?  The preferred location for receiving shots is your prescribing allergist's office.  Injections can sometimes be given at another facility where the physician and staff are trained to recognize and treat reactions, and have received instructions by your prescribing allergist.

## 2022-11-16 NOTE — Progress Notes (Addendum)
NEW PATIENT  Date of Service/Encounter:  11/16/22  Consult requested by: [facility]   Assessment:   Allergic urticaria  Seasonal and perennial allergic rhinitis - on allergy shots from an outside provider  Syncopal episodes with headaches - with a normal head CT in October 2022  Perennial and seasonal allergic rhinitis  Migraines - follows with Dr. Ocie Doyne  Latex allergy    Kelsey Craig presents to transfer care from her other allergist.  We will see was in her current set of vitals before we make changes.  She is already paid for this set of vitals, so I hate to remix them.  When we do have to remix her set of vitals, we are going to increase the dander and dust mites since these seem to be her primary triggers.  I do want to see the skin testing as well.  Again, I do not want to retest with since she has been tested in the last year and was just add cost. I think we can revisit this idea in 6 months when she has been on maintenance for a longer period of time. We are going to get a CT angiogram of her head and neck due to his history of syncope and headaches.  I wonder if she has some kind of vascular issue contributing to this such as fibromuscular dysplasia or a vascular blockage.    Plan/Recommendations:   1. Seasonal and perennial allergic rhinitis - We will get the full records from [facility]. - Your first injection is scheduled, but subsequent ones are walk in during the hours that we are open. - Start Ryaltris one spray per nostril twice daily (sample provided). - Continue with cetirizine, but increase to one tablet TWICE daily.  - We might remix your vials to increase the cat and dog and dust mite to make them more efficacious.   2. Allergic urticaria - You seem to have this under control. - We can send in a topical steroid if needed.   3. Syncope - We are going to order a CT angiogram to check out the vasculature of your head and neck. - I am going  to send my note to your Neurologist.   4. Return in about 3 months (around 02/15/2023).   This note in its entirety was forwarded to the Provider who requested this consultation.  Subjective:   Kelsey Craig is a 32 y.o. female presenting today for evaluation of  Chief Complaint  Patient presents with   Establish Care    Establish care     Kelsey Craig has a history of the following: Patient Active Problem List   Diagnosis Date Noted   Gestational hypertension 09/29/2019   Encounter for planned induction of labor 09/28/2019   SVD  03/29/2018   Postpartum care following vaginal delivery (12/8) 03/29/2018   Second-degree perineal laceration, with delivery 03/29/2018   Chronic hepatitis C affecting pregnancy, antepartum (HCC) 02/20/2018    History obtained from: chart review and patient.  Kelsey Craig was referred by [facility].     Kelsey Craig is a 32 y.o. female presenting for an evaluation of environmental allergies and syncopal episodes .  Allergic Rhinitis Symptom History: She moved here from [place] around 7 or 8 years ago. Then all of the allergic symptoms started shortly thereafter. She is allergic to cats (which she has). She had a dog growing up and she started having recurrent sinus infections once they got a dog.  She was taking OTC medications regularly to help with her symptoms. But nothing seemed to help. She had tested done in January 2023 and she started the injections June 2023.  She gets three injections weekly. She received them yesterday and she just finished the second vial. She has an appointment on Monday to start on the Red Vials (the most concentrated). She does report that she has knots on her arms every time that she gets her injections. It caries but some are worse than others. She does have an EpiPen. She has not tried getting off of her medications. She does not feel that she has gotten anywhere.   She is currently taking  cetirizine once daily. She has tried [employer] but this does not always help. She has tried everything over the counter. She has a prescription for carbinoxamine tablets and that did not help. It does take the edge oiff but she is never "not stuffy". She has tried [place].   She was getting antibiotics regularly before starting injections; she was getting them every 2-3 months. Last antibiotics at this time was a while ago. She otherwise does not have any other infections.   She does have migraines and sees Dr. Delena Bali. She has a history of bone spurs in her neck. She has some episodes of passing out with a deja vu sensation. It causes an immediate migraine. She has had an EEG that was normal. She has never had an MRA at all. She was on Emgality and she stopped because she did not tolerate the side effects. She has not tried Amiovig or Botox injections. She has had these episodes for years. Her mother also had these episodes. Kelsey Craig's is definitely more severe than her mothers.   CT HEAD WITHOUT CONTRAST (October 2022)   TECHNIQUE: Contiguous axial images were obtained from the base of the skull through the vertex without intravenous contrast.   COMPARISON:  None.   FINDINGS: Brain: There is no evidence for acute hemorrhage, hydrocephalus, mass lesion, or abnormal extra-axial fluid collection. No definite CT evidence for acute infarction.   Vascular: No hyperdense vessel or unexpected calcification.   Skull: No evidence for fracture. No worrisome lytic or sclerotic lesion.   Sinuses/Orbits: Mucosal thickening noted sphenoid sinuses. Remaining paranasal sinuses and mastoid air cells are clear. Visualized portions of the globes and intraorbital fat are unremarkable.   Other: None.   IMPRESSION: 1. No acute intracranial abnormality. 2. Chronic sphenoid sinus disease.    Skin Symptom History: She reports that she has hives relatively infrequently. She definitely gets it after touching cats.  These  are not too serious.   She has a history of hepatisis C. She was pregnant with her first when this was discovered. She received the anviral treatment 3 years ago and she is since clear. She is now having no problems at all and she has been discharged from Gastroenterology entirely.   She is a [facility] with two boys - ages 72 and 78. She lives here in [place]. The youngest is allergic to everything. He has more food allergies including soy and eggs and sesame.   Otherwise, there is no history of other atopic diseases, including asthma, food allergies, drug allergies, stinging insect allergies, or contact dermatitis. There is no significant infectious history. Vaccinations are up to date.    Past Medical History: Patient Active Problem List   Diagnosis Date Noted   Gestational hypertension 09/29/2019   Encounter for planned induction of labor 09/28/2019   SVD  03/29/2018   Postpartum care following vaginal delivery (12/8) 03/29/2018   Second-degree perineal laceration, with delivery 03/29/2018   Chronic hepatitis C affecting pregnancy, antepartum ([place]) 02/20/2018    Medication List:  Allergies as of 11/16/2022       Reactions   Latex Hives, Swelling        Medication List        Accurate as of November 16, 2022 12:52 PM. If you have any questions, ask your nurse or doctor.          Emgality 120 MG/ML Soaj Generic drug: Galcanezumab-gnlm Inject 120 mg into the skin every 30 (thirty) days.   fluticasone 50 MCG/ACT nasal spray Commonly known as: FLONASE Place 1 spray into both nostrils daily as needed for allergies or rhinitis.   ibuprofen 600 MG tablet Commonly known as: ADVIL Take 1 tablet (600 mg total) by mouth every 6 (six) hours as needed.   loratadine 10 MG tablet Commonly known as: CLARITIN Take 10 mg by mouth daily.   multivitamin with minerals Tabs tablet Take 1 tablet by mouth daily.   rizatriptan 10 MG tablet Commonly known as: Maxalt Take 1 tablet (10 mg  total) by mouth as needed for migraine. May repeat in 2 hours if needed   Ubrelvy 50 MG Tabs Generic drug: Ubrogepant Take 1 tablet by mouth daily as needed.        Birth History: non-contributory  Developmental History: non-contributory  Past Surgical History: Past Surgical History:  Procedure Laterality Date   BREAST MASS EXCISION     BREAST SURGERY     CHEILECTOMY Right 04/18/2022   Procedure: CHEILECTOMY RIGHT HALLUX;  Surgeon: Tyson Babinski, DPM;  Location: [facility];  Service: Podiatry;  Laterality: Right;   TONSILLECTOMY       Family History: Family History  Problem Relation Age of Onset   Hypertension Mother    Diabetes Father      Social History: Kelsey Craig lives at home with her 2 children as well as her husband.  She is from [place], but her husband is from the [place] area.  He is a Administrator and often sounds.  They live in a house that is 30 years old.  There is carpeting throughout the home.  They have electric heating and central cooling.  There are cats and dogs inside of the home.  There are no dust mite covers on the bedding.  There is no tobacco exposure.  She currently works as a Printmaker.  There is no HEPA filter in the home.  There is no fume, chemical, or dust exposure.  Review of Systems  Constitutional: Negative.  Negative for chills, fever, malaise/fatigue and weight loss.  HENT:  Positive for congestion and sore throat. Negative for ear discharge, ear pain and sinus pain.        Positive for postnasal drip.  Eyes:  Negative for pain, discharge and redness.  Respiratory:  Negative for cough, sputum production, shortness of breath and wheezing.   Cardiovascular: Negative.  Negative for chest pain and palpitations.  Gastrointestinal:  Negative for abdominal pain, constipation, diarrhea, heartburn, nausea and vomiting.  Skin: Negative.  Negative for itching and rash.  Neurological:  Positive for dizziness, loss of consciousness and  headaches.  Endo/Heme/Allergies:  Positive for environmental allergies. Does not bruise/bleed easily.       Objective:   Blood pressure 128/70, pulse 100, temperature 97.9 F (36.6 C), resp. rate 20, height 5\' 6"  (1.676 m), weight 206 lb (  93.4 kg), last menstrual period 10/18/2022, SpO2 98 %. Body mass index is 33.25 kg/m.     Physical Exam Vitals reviewed.  Constitutional:      Appearance: She is well-developed.     Comments: Very lovely.  Personable.  HENT:     Head: Normocephalic and atraumatic.     Comments: No nasal polyps noted.    Right Ear: Tympanic membrane, ear canal and external ear normal. No drainage, swelling or tenderness. Tympanic membrane is not injected, scarred, erythematous, retracted or bulging.     Left Ear: Tympanic membrane, ear canal and external ear normal. No drainage, swelling or tenderness. Tympanic membrane is not injected, scarred, erythematous, retracted or bulging.     Nose: No nasal deformity, septal deviation, mucosal edema or rhinorrhea.     Right Turbinates: Enlarged, swollen and pale.     Left Turbinates: Enlarged, swollen and pale.     Right Sinus: No maxillary sinus tenderness or frontal sinus tenderness.     Left Sinus: No maxillary sinus tenderness or frontal sinus tenderness.     Mouth/Throat:     Mouth: Mucous membranes are not pale and not dry.     Pharynx: Uvula midline.  Eyes:     General:        Right eye: No discharge.        Left eye: No discharge.     Conjunctiva/sclera: Conjunctivae normal.     Right eye: Right conjunctiva is not injected. No chemosis.    Left eye: Left conjunctiva is not injected. No chemosis.    Pupils: Pupils are equal, round, and reactive to light.  Cardiovascular:     Rate and Rhythm: Normal rate and regular rhythm.     Heart sounds: Normal heart sounds.  Pulmonary:     Effort: Pulmonary effort is normal. No tachypnea, accessory muscle usage or respiratory distress.     Breath sounds: Normal  breath sounds. No wheezing, rhonchi or rales.  Chest:     Chest wall: No tenderness.  Abdominal:     Tenderness: There is no abdominal tenderness. There is no guarding or rebound.  Lymphadenopathy:     Head:     Right side of head: No submandibular, tonsillar or occipital adenopathy.     Left side of head: No submandibular, tonsillar or occipital adenopathy.     Cervical: No cervical adenopathy.  Skin:    Coloration: Skin is not pale.     Findings: No abrasion, erythema, petechiae or rash. Rash is not papular, urticarial or vesicular.  Neurological:     Mental Status: She is alert.  Psychiatric:        Behavior: Behavior is cooperative.      Diagnostic studies: none          Salvatore Marvel, MD [facility] and [facility] [place]

## 2022-11-19 ENCOUNTER — Telehealth: Payer: Self-pay

## 2022-11-19 NOTE — Telephone Encounter (Signed)
Yes she had just started her maintenance vial. So let's restart at 0.025 mL and advance on Schedule A.   Thanks, Salvatore Marvel, MD Allergy and [facility] [place]

## 2022-11-19 NOTE — Telephone Encounter (Addendum)
Patient came by today and dropped off her vials and the flow sheet from [facility] Please advise on where to start and what schedule to continue on as dosing and vials are different from ours. Per her records it seems as if she should start at 0.025 of her red vial.

## 2022-11-26 ENCOUNTER — Telehealth: Payer: Self-pay

## 2022-11-26 NOTE — Telephone Encounter (Signed)
Called patient to schedule her new start appointment and she expressed that she would call the office tomorrow to schedule her appointment for her allergy injections.

## 2022-11-26 NOTE — Telephone Encounter (Signed)
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 I definitely want a CTA of her head and neck. I amended the note.   Salvatore Marvel, MD Allergy and [facility] [place]

## 2022-11-26 NOTE — Telephone Encounter (Addendum)
Per your office notes:  We are not going to get a CT angiogram of her head and neck due to his history of syncope and headaches.   No CT angiogram will be scheduled.   ----- Message from Valentina Shaggy, MD sent at 11/19/2022 11:20 PM EST ----- Kelsey Craig - so they will call her to schedule it?   ----- Message ----- From: Cristopher Peru, CMA Sent: 11/16/2022   2:28 PM EST To: Valentina Shaggy, MD  Per insurance PA required only for admissions and specialty pharmacy.  ----- Message ----- From: Valentina Shaggy, MD Sent: 11/16/2022   1:05 PM EST To: [facility]  I ordered a CTA of her head and neck. Unsure if there is a PA process.

## 2022-11-26 NOTE — Progress Notes (Signed)
We obtained her shot records from her previous allergist.  She was receiving 3 injections.  The first vial contained ragweed, weed mix, tree mix, and dust mites.  The second vial contains grass mix, [place], [place] grass, Johnson grass, cat, and dog.  The third vial contained Alternaria, Cladosporium, Penicillium, Bipolaris, Drechslera, Mucor, Fusarium, Aureobasidium, Rhizopus, and cockroach.   These will be scanned into the system.  When new vials are needed, we will remix them in her own office into 2 bottles since the third vial is never covered by insurance.  Shot records will be scanned into the system.  Salvatore Marvel, MD Allergy and [facility] [place]

## 2022-11-27 NOTE — Telephone Encounter (Addendum)
I will contact Centralized scheduling tomorrow.

## 2022-11-28 NOTE — Telephone Encounter (Signed)
Perfecto!   Thanks!   Salvatore Marvel, MD Allergy and [facility] [place]

## 2022-11-30 ENCOUNTER — Ambulatory Visit (INDEPENDENT_AMBULATORY_CARE_PROVIDER_SITE_OTHER): Payer: BC Managed Care – PPO

## 2022-11-30 DIAGNOSIS — J309 Allergic rhinitis, unspecified: Secondary | ICD-10-CM

## 2022-12-05 ENCOUNTER — Ambulatory Visit (INDEPENDENT_AMBULATORY_CARE_PROVIDER_SITE_OTHER): Payer: BC Managed Care – PPO

## 2022-12-05 DIAGNOSIS — J309 Allergic rhinitis, unspecified: Secondary | ICD-10-CM | POA: Diagnosis not present

## 2022-12-12 ENCOUNTER — Ambulatory Visit (INDEPENDENT_AMBULATORY_CARE_PROVIDER_SITE_OTHER): Payer: BC Managed Care – PPO

## 2022-12-12 DIAGNOSIS — J309 Allergic rhinitis, unspecified: Secondary | ICD-10-CM

## 2022-12-21 ENCOUNTER — Ambulatory Visit (INDEPENDENT_AMBULATORY_CARE_PROVIDER_SITE_OTHER): Payer: BC Managed Care – PPO

## 2022-12-21 DIAGNOSIS — J309 Allergic rhinitis, unspecified: Secondary | ICD-10-CM | POA: Diagnosis not present

## 2023-01-04 ENCOUNTER — Ambulatory Visit (INDEPENDENT_AMBULATORY_CARE_PROVIDER_SITE_OTHER): Payer: BC Managed Care – PPO | Admitting: *Deleted

## 2023-01-04 DIAGNOSIS — J309 Allergic rhinitis, unspecified: Secondary | ICD-10-CM | POA: Diagnosis not present

## 2023-01-08 ENCOUNTER — Ambulatory Visit (HOSPITAL_COMMUNITY): Payer: BC Managed Care – PPO

## 2023-01-11 ENCOUNTER — Ambulatory Visit (INDEPENDENT_AMBULATORY_CARE_PROVIDER_SITE_OTHER): Payer: BC Managed Care – PPO

## 2023-01-11 DIAGNOSIS — J309 Allergic rhinitis, unspecified: Secondary | ICD-10-CM

## 2023-01-14 ENCOUNTER — Telehealth: Payer: Self-pay | Admitting: Allergy & Immunology

## 2023-01-14 DIAGNOSIS — J302 Other seasonal allergic rhinitis: Secondary | ICD-10-CM

## 2023-01-14 NOTE — Progress Notes (Signed)
VIALS NOT MADE UNTIL OFFICE ORDERS.

## 2023-01-14 NOTE — Progress Notes (Signed)
Aeroallergen Immunotherapy   Ordering Provider: Dr. Salvatore Marvel   Patient Details  Name: Kelsey Craig  MRN: [id number]  Date of Birth: 1990-10-24   Order 1 of 2   Vial Label: G/W/T/C/D   0.3 ml (Volume)  BAU Concentration -- 7 Grass Mix* 100,000 ([address] [place], [place], [place], [place], [place], Sweet Vern[place], Timothy)  0.2 ml (Volume)  1:20 Concentration -- [place]  0.3 ml (Volume)  BAU Concentration -- [place] 10,000  0.2 ml (Volume)  1:20 Concentration -- Johnson  0.3 ml (Volume)  1:20 Concentration -- Ragweed Mix  0.5 ml (Volume)  1:20 Concentration -- Weed Mix*  0.8 ml (Volume)  1:20 Concentration -- Eastern 10 Tree Mix (also Sweet Gum)  0.5 ml (Volume)  1:10 Concentration -- Cat Hair  0.5 ml (Volume)  1:10 Concentration -- Dog Epithelia    3.6  ml Extract Subtotal  1.4  ml Diluent  5.0  ml Maintenance Total   Schedule:  C   Red Vial (1:100): Schedule C (5 doses)   Special Instructions: none

## 2023-01-14 NOTE — Progress Notes (Signed)
Aeroallergen Immunotherapy   Ordering Provider: Dr. Salvatore Marvel   Patient Details  Name: Kelsey Craig  MRN: [id number]  Date of Birth: 03-10-1991   Order 2 of 2   Vial Label: Molds/CR/DM   0.2 ml (Volume)  1:20 Concentration -- Alternaria alternata  0.2 ml (Volume)  1:20 Concentration -- Cladosporium herbarum  0.2 ml (Volume)  1:10 Concentration -- Penicillium mix  0.2 ml (Volume)  1:20 Concentration -- Bipolaris sorokiniana  0.2 ml (Volume)  1:20 Concentration -- Drechslera spicifera  0.2 ml (Volume)  1:10 Concentration -- Mucor plumbeus  0.2 ml (Volume)  1:10 Concentration -- Fusarium moniliforme  0.2 ml (Volume)  1:40 Concentration -- Aureobasidium pullulans  0.2 ml (Volume)  1:10 Concentration -- Rhizopus oryzae  0.3 ml (Volume)  1:20 Concentration -- Cockroach, German  0.8 ml (Volume)   AU Concentration -- Mite Mix (DF 5,000 & DP 5,000)    2.9  ml Extract Subtotal  2.1  ml Diluent  5.0  ml Maintenance Total   Schedule:  C   Red Vial (1:100): Schedule C (5 doses)   Special Instructions: none

## 2023-01-14 NOTE — Telephone Encounter (Signed)
Patient is nearly out of her outside clinic vials. I ordered vials from [place] and combined them into two vials.   Salvatore Marvel, MD Allergy and [facility] [place]

## 2023-01-24 ENCOUNTER — Encounter: Payer: Self-pay | Admitting: Allergy & Immunology

## 2023-01-25 ENCOUNTER — Ambulatory Visit (HOSPITAL_COMMUNITY): Payer: BC Managed Care – PPO

## 2023-01-30 DIAGNOSIS — J3089 Other allergic rhinitis: Secondary | ICD-10-CM

## 2023-01-30 NOTE — Progress Notes (Signed)
VIALS EXP 01-30-24

## 2023-01-30 NOTE — Telephone Encounter (Signed)
I think it is okay to hold off on the study for now since she hasn't had any new syncopal episodes. We can always re-evaluate if she has another one. Thanks!

## 2023-02-01 ENCOUNTER — Ambulatory Visit (INDEPENDENT_AMBULATORY_CARE_PROVIDER_SITE_OTHER): Payer: BC Managed Care – PPO

## 2023-02-01 DIAGNOSIS — J309 Allergic rhinitis, unspecified: Secondary | ICD-10-CM | POA: Diagnosis not present

## 2023-02-08 ENCOUNTER — Ambulatory Visit (INDEPENDENT_AMBULATORY_CARE_PROVIDER_SITE_OTHER): Payer: BC Managed Care – PPO

## 2023-02-08 DIAGNOSIS — J309 Allergic rhinitis, unspecified: Secondary | ICD-10-CM

## 2023-02-15 ENCOUNTER — Other Ambulatory Visit: Payer: Self-pay

## 2023-02-15 ENCOUNTER — Encounter (HOSPITAL_COMMUNITY): Payer: Self-pay

## 2023-02-15 ENCOUNTER — Ambulatory Visit (INDEPENDENT_AMBULATORY_CARE_PROVIDER_SITE_OTHER): Payer: BC Managed Care – PPO

## 2023-02-15 ENCOUNTER — Emergency Department (HOSPITAL_COMMUNITY): Payer: BC Managed Care – PPO

## 2023-02-15 ENCOUNTER — Emergency Department (HOSPITAL_COMMUNITY)
Admission: EM | Admit: 2023-02-15 | Discharge: 2023-02-15 | Disposition: A | Discharge status: Home / Self Care | Payer: BC Managed Care – PPO | Attending: Emergency Medicine | Admitting: Emergency Medicine

## 2023-02-15 DIAGNOSIS — S0990XA Unspecified injury of head, initial encounter: Secondary | ICD-10-CM | POA: Insufficient documentation

## 2023-02-15 DIAGNOSIS — E876 Hypokalemia: Secondary | ICD-10-CM | POA: Insufficient documentation

## 2023-02-15 DIAGNOSIS — S01512A Laceration without foreign body of oral cavity, initial encounter: Secondary | ICD-10-CM | POA: Insufficient documentation

## 2023-02-15 DIAGNOSIS — W07XXXA Fall from chair, initial encounter: Secondary | ICD-10-CM | POA: Insufficient documentation

## 2023-02-15 DIAGNOSIS — R55 Syncope and collapse: Secondary | ICD-10-CM | POA: Insufficient documentation

## 2023-02-15 DIAGNOSIS — Z91040 Latex allergy status: Secondary | ICD-10-CM | POA: Insufficient documentation

## 2023-02-15 DIAGNOSIS — J309 Allergic rhinitis, unspecified: Secondary | ICD-10-CM

## 2023-02-15 LAB — RAPID URINE DRUG SCREEN, HOSP PERFORMED
Amphetamines: NOT DETECTED
Barbiturates: NOT DETECTED
Benzodiazepines: NOT DETECTED
Cocaine: NOT DETECTED
Opiates: NOT DETECTED
Tetrahydrocannabinol: POSITIVE — AB

## 2023-02-15 LAB — URINALYSIS, ROUTINE W REFLEX MICROSCOPIC
Bilirubin Urine: NEGATIVE
Glucose, UA: NEGATIVE mg/dL
Hgb urine dipstick: NEGATIVE
Ketones, ur: NEGATIVE mg/dL
Leukocytes,Ua: NEGATIVE
Nitrite: NEGATIVE
Protein, ur: NEGATIVE mg/dL
Specific Gravity, Urine: 1.008 (ref 1.005–1.030)
pH: 6 (ref 5.0–8.0)

## 2023-02-15 LAB — LACTIC ACID, PLASMA
Lactic Acid, Venous: 0.7 mmol/L (ref 0.5–1.9)
Lactic Acid, Venous: 0.7 mmol/L (ref 0.5–1.9)

## 2023-02-15 LAB — BASIC METABOLIC PANEL
Anion gap: 8 (ref 5–15)
BUN: 12 mg/dL (ref 6–20)
CO2: 25 mmol/L (ref 22–32)
Calcium: 8.7 mg/dL — ABNORMAL LOW (ref 8.9–10.3)
Chloride: 101 mmol/L (ref 98–111)
Creatinine, Ser: 0.69 mg/dL (ref 0.44–1.00)
GFR, Estimated: >60 mL/min (ref >60)
Glucose, Bld: 84 mg/dL (ref 70–99)
Potassium: 3.3 mmol/L — ABNORMAL LOW (ref 3.5–5.1)
Sodium: 134 mmol/L — ABNORMAL LOW (ref 135–145)

## 2023-02-15 LAB — ETHANOL: Alcohol, Ethyl (B): <10 mg/dL (ref <10)

## 2023-02-15 LAB — CBC
HCT: 40.2 % (ref 36.0–46.0)
Hemoglobin: 13.6 g/dL (ref 12.0–15.0)
MCH: 30.7 pg (ref 26.0–34.0)
MCHC: 33.8 g/dL (ref 30.0–36.0)
MCV: 90.7 fL (ref 80.0–100.0)
Platelets: 291 10*3/uL (ref 150–400)
RBC: 4.43 MIL/uL (ref 3.87–5.11)
RDW: 12.7 % (ref 11.5–15.5)
WBC: 9.9 10*3/uL (ref 4.0–10.5)
nRBC: 0 % (ref 0.0–0.2)

## 2023-02-15 LAB — POC URINE PREG, ED: Preg Test, Ur: NEGATIVE

## 2023-02-15 MED ORDER — POTASSIUM CHLORIDE CRYS ER 20 MEQ PO TBCR
40.0000 meq | EXTENDED_RELEASE_TABLET | Freq: Once | ORAL | Status: AC
  Administered 2023-02-15: 40 meq via ORAL
  Filled 2023-02-15: qty 2

## 2023-02-15 NOTE — Discharge Instructions (Signed)
Thank you for letting us take care of you today.  Overall, your workup is very reassuring.  Your head CT was normal.  Your chest x-ray was normal.  You had a slightly depleted potassium level which was replaced but otherwise your blood work and urine was okay today as well.  With your unexplained syncopal episodes, I do recommend that you follow-up with your neurologist who you have establish care with.  I will also refer you to cardiology for further evaluation.  If you do not hear from them within the next 72 hours, please call their office to schedule this follow-up appointment.  You should not drive until cleared by your outpatient team.  If you do not currently have a PCP, I also recommend that you establish care with with a PCP.  I have provided 2 clinics that you may contact to set up an appointment or you may go to a PCP of your own choosing.  For any new or worsening symptoms such as chest pain, shortness of breath, recurrent syncope, severe headache, vision changes, or other new, concerning symptoms, please return to the nearest emergency department for reevaluation.

## 2023-02-15 NOTE — ED Triage Notes (Signed)
Pt BIB [facility]. Pt presents with 1 episode of syncope. Per EMS pt started feeling lightheaded so she sat in a chair and had a syncopal episode. Pt did fall out of the chair and pt reports pain to R shoulder and reports tongue trauma. Pt c/o migraine type HA. Pt previously had a similar experience in October of last year.

## 2023-02-15 NOTE — ED Provider Notes (Signed)
[place] EMERGENCY DEPARTMENT AT [facility] Provider Note   CSN: [id number] Arrival date & time: 02/15/23  1503     History  Chief Complaint  Patient presents with   Loss of Consciousness    Kelsey Craig is a 32 y.o. female with past medical history of migraines who presents to the ED complaining of syncopal episode.  Patient states that she was standing in her kitchen when she felt a warm sensation throughout her body and then lost consciousness and was found on the floor by her kids.  Notes that she believes that she had LOC of approximately 30 seconds.  She did hit the top of her head during the fall.  She complains of severe headache from this injury.  She denies associated vision changes, nausea, vomiting, neck or back pain.  Reports that she had 1 similar episode in the fall 2023 for which she was evaluated by neurology with an EEG which was normal.  She does have a history of migraine headaches but is not on any daily preventatives.  She is on a rescue medication as needed which she request to take in the ED for her headache in setting of medication ordered here.  She denies preceding headache before syncopal episode.  She denies any chest pain, shortness of breath, palpitations, dizziness, abdominal pain, or other associated symptoms preceding, during, or following syncopal episode.  No previous cardiac evaluation.  No family history of sudden unexplained death.  Patient reports that she has concern for seizure as she bit her tongue during the episode today.  She has not previously been on antiepileptic medications and has no previous history of underlying seizure disorder.  She denies leg pain or swelling.  No history of DVT/PE.  No recent activity changes, no recent surgery, no recent travel.      Home Medications Maxalt  Allergies    Latex    Review of Systems   Review of Systems  All other systems reviewed and are negative.   Physical Exam Updated Vital  Signs BP 130/80   Pulse 69   Temp 98.7 F (37.1 C)   Resp 19   Ht 5\' 6"  (1.676 m)   Wt 90.7 kg   LMP 02/15/2023   SpO2 99%   BMI 32.28 kg/m  Physical Exam Vitals and nursing note reviewed.  Constitutional:      General: She is not in acute distress.    Appearance: Normal appearance.  HENT:     Head: Normocephalic and atraumatic.     Comments: No significant hematoma, contusion, abrasion/laceration, or other wounds noted to the face or head    Right Ear: External ear normal.     Left Ear: External ear normal.     Mouth/Throat:     Mouth: Mucous membranes are moist.     Comments: Small less than 0.5 cm superficial laceration to the right lateral aspect of the tongue, no active bleeding Eyes:     General: No scleral icterus.    Extraocular Movements: Extraocular movements intact.     Conjunctiva/sclera: Conjunctivae normal.     Pupils: Pupils are equal, round, and reactive to light.  Cardiovascular:     Rate and Rhythm: Normal rate and regular rhythm.     Heart sounds: No murmur heard. Pulmonary:     Effort: Pulmonary effort is normal. No respiratory distress.     Breath sounds: Normal breath sounds. No stridor. No wheezing, rhonchi or rales.  Chest:  Chest wall: No tenderness.  Abdominal:     General: Abdomen is flat. There is no distension.     Palpations: Abdomen is soft.     Tenderness: There is no abdominal tenderness. There is no guarding or rebound.  Musculoskeletal:        General: No deformity. Normal range of motion.     Cervical back: Normal range of motion and neck supple. No rigidity or tenderness.     Right lower leg: No edema.     Left lower leg: No edema.     Comments: No midline CTL spinal tenderness, step-offs, or deformities, no calf tenderness bilaterally  Skin:    General: Skin is warm and dry.     Capillary Refill: Capillary refill takes less than 2 seconds.  Neurological:     General: No focal deficit present.     Mental Status: She is alert  and oriented to person, place, and time.     GCS: GCS eye subscore is 4. GCS verbal subscore is 5. GCS motor subscore is 6.     Cranial Nerves: Cranial nerves 2-12 are intact. No cranial nerve deficit, dysarthria or facial asymmetry.     Sensory: Sensation is intact.     Motor: Motor function is intact. No weakness, tremor, atrophy, abnormal muscle tone or seizure activity.     Coordination: Coordination is intact.  Psychiatric:        Behavior: Behavior normal.     ED Results / Procedures / Treatments   Labs (all labs ordered are listed, but only abnormal results are displayed) Labs Reviewed  BASIC METABOLIC PANEL - Abnormal; Notable for the following components:      Result Value   Sodium 134 (*)    Potassium 3.3 (*)    Calcium 8.7 (*)    All other components within normal limits  URINALYSIS, ROUTINE W REFLEX MICROSCOPIC - Abnormal; Notable for the following components:   Color, Urine STRAW (*)    All other components within normal limits  RAPID URINE DRUG SCREEN, HOSP PERFORMED - Abnormal; Notable for the following components:   Tetrahydrocannabinol POSITIVE (*)    All other components within normal limits  CBC  LACTIC ACID, PLASMA  LACTIC ACID, PLASMA  ETHANOL  POC URINE PREG, ED    EKG EKG Interpretation  Date/Time:  Friday February 15 2023 15:29:03 EDT Ventricular Rate:  66 PR Interval:  152 QRS Duration: 88 QT Interval:  388 QTC Calculation: 406 R Axis:   59 Text Interpretation: Normal sinus rhythm with sinus arrhythmia Normal ECG When compared with ECG of 03-Aug-2021 23:43, No significant change since last tracing Confirmed by Alvino Blood ([id number]) on 02/15/2023 3:45:49 PM  Radiology DG Chest 2 View  Result Date: 02/15/2023 CLINICAL DATA:  Syncope EXAM: CHEST - 2 VIEW COMPARISON:  Report for previous study done on 03/17/2023. Images of the previous study are not available for review. FINDINGS: The heart size and mediastinal contours are within normal limits.  Both lungs are clear. The visualized skeletal structures are unremarkable. IMPRESSION: No active cardiopulmonary disease. Electronically Signed   By: Ernie Avena M.D.   On: 02/15/2023 16:42   CT HEAD WO CONTRAST  Result Date: 02/15/2023 CLINICAL DATA:  New onset seizures, syncope EXAM: CT HEAD WITHOUT CONTRAST TECHNIQUE: Contiguous axial images were obtained from the base of the skull through the vertex without intravenous contrast. RADIATION DOSE REDUCTION: This exam was performed according to the departmental dose-optimization program which includes automated exposure control, adjustment of  the mA and/or kV according to patient size and/or use of iterative reconstruction technique. COMPARISON:  08/03/2021 FINDINGS: Beam hardening artifacts caused by metallic earrings limit evaluation. Brain: No acute intracranial findings are seen. There are no signs of bleeding within the cranium. There is no focal edema or mass effect. Ventricles are not dilated. Vascular: Unremarkable. Skull: No acute findings are seen. Sinuses/Orbits: There is mucosal thickening in ethmoid and maxillary sinuses. Other: None. IMPRESSION: No acute intracranial findings are seen in noncontrast CT brain. Chronic sinusitis. Electronically Signed   By: Ernie Avena M.D.   On: 02/15/2023 16:41    Procedures Procedures   Orthostatic Lying BP- Lying: 127/80 (patient stated that she felt intercranial pressure while laying down pain level stated 6 out of 10 while laying.) Pulse- Lying: 64 Orthostatic Sitting BP- Sitting: 129/83 Pulse- Sitting: 65 Orthostatic Standing at 3 minutes BP- Standing at 3 minutes: 133/103 Abnormal  (Pt stated she feels a little unsteady but does not feel like she will fall.) Pulse- Standing at 3 minutes: 75    Medications Ordered in ED Medications  potassium chloride SA (KLOR-CON M) CR tablet 40 mEq (40 mEq Oral Given 02/15/23 1836)    ED Course/ Medical Decision Making/ A&P                              Medical Decision Making Amount and/or Complexity of Data Reviewed Labs: ordered. Decision-making details documented in ED Course. Radiology: ordered. Decision-making details documented in ED Course. ECG/medicine tests: ordered. Decision-making details documented in ED Course.   Medical Decision Making:   Kelsey Craig is a 32 y.o. female who presented to the ED today with syncope detailed above.    Patient's presentation is complicated by their history of previous episode, alcohol use disorder, migraine headaches.  Complete initial physical exam performed, notably the patient was in no acute distress and neurologically intact.  Very small laceration to the lateral aspect of the tongue on the right side.  No significant head trauma.  Abdomen soft and nontender.  Patient did not appear clinically dehydrated.  No spinal tenderness or deformities.    Reviewed and confirmed nursing documentation for past medical history, family history, social history.    Initial Assessment:   With the patient's presentation of syncope, differential diagnosis includes but is not limited to BPPV, vestibular migraine, head trauma, AVM, intracranial tumor, multiple sclerosis, drug-related, CVA, vasovagal syncope, orthostatic hypotension, sepsis, hypoglycemia, electrolyte disturbance, anemia, anxiety/panic attack  This is most consistent with an acute complicated illness  Initial Plan:  Screening labs including CBC and Metabolic panel to evaluate for infectious or metabolic etiology of disease.  Urinalysis with reflex culture and UDS ordered to evaluate for UTI or relevant urologic/nephrologic pathology.  CXR to evaluate for structural/infectious intrathoracic pathology.  EKG to evaluate for cardiac pathology CT brain to assess for intracranial pathology Orthostatic vital signs Lactic acid to assess probability of seizure like activity / sepsis Symptomatic treatment offered but patient declines  in favor of taking her home triptan for headache Objective evaluation as below reviewed   Initial Study Results:   Laboratory  All laboratory results reviewed without evidence of clinically relevant pathology.   Exceptions include: Sodium 134, potassium 3.3, UDS positive for THC  EKG EKG was reviewed independently. Rate, rhythm, axis, intervals all examined and without medically relevant abnormality. ST segments without concerns for elevations.    Radiology:  All images reviewed independently. Agree with  radiology report at this time.   DG Chest 2 View  Result Date: 02/15/2023 CLINICAL DATA:  Syncope EXAM: CHEST - 2 VIEW COMPARISON:  Report for previous study done on 03/17/2023. Images of the previous study are not available for review. FINDINGS: The heart size and mediastinal contours are within normal limits. Both lungs are clear. The visualized skeletal structures are unremarkable. IMPRESSION: No active cardiopulmonary disease. Electronically Signed   By: Ernie Avena M.D.   On: 02/15/2023 16:42   CT HEAD WO CONTRAST  Result Date: 02/15/2023 CLINICAL DATA:  New onset seizures, syncope EXAM: CT HEAD WITHOUT CONTRAST TECHNIQUE: Contiguous axial images were obtained from the base of the skull through the vertex without intravenous contrast. RADIATION DOSE REDUCTION: This exam was performed according to the departmental dose-optimization program which includes automated exposure control, adjustment of the mA and/or kV according to patient size and/or use of iterative reconstruction technique. COMPARISON:  08/03/2021 FINDINGS: Beam hardening artifacts caused by metallic earrings limit evaluation. Brain: No acute intracranial findings are seen. There are no signs of bleeding within the cranium. There is no focal edema or mass effect. Ventricles are not dilated. Vascular: Unremarkable. Skull: No acute findings are seen. Sinuses/Orbits: There is mucosal thickening in ethmoid and maxillary  sinuses. Other: None. IMPRESSION: No acute intracranial findings are seen in noncontrast CT brain. Chronic sinusitis. Electronically Signed   By: Ernie Avena M.D.   On: 02/15/2023 16:41     Final Assessment and Plan:   32 year old female presents to the ED for unexplained syncopal episode.  History of 1 similar episode in the fall 2023.  Evaluated by neurology for this with negative EEG.  History of migraine headaches but no other significant neurologic history.  Vital signs stable.  Patient is nontoxic-appearing.  She is neurologically intact.  She has a small laceration to the right lateral tongue but no active bleeding.  Abdomen soft and nontender.  No associated chest pain or shortness of breath.  No calf tenderness or lower extremity edema.  No identifiable risk factors for DVT/PE.  No hypoxia or signs of respiratory distress.  No signs of significant head trauma but with the unwitnessed syncopal episode further workup including CT brain obtained as above.  CT brain negative.  Chest x-ray negative.  EKG normal.  Minimal hyponatremia and hypokalemia at 134 and 3.3 respectively.  Patient tolerating p.o. without difficulty so can complete at home following small dose of p.o. potassium here.  Normal kidney function.  Negative orthostatics.  Patient does not appear clinically dehydrated.  No leukocytosis.  No fever.  Low suspicion for infectious process.  No meningismus.  No spinal tenderness.  No signs of significant traumatic injury.  Urine negative.  UDS positive for THC only.  Overall, very reassuring workup.  Patient has already had outpatient neurology evaluation for unexplained syncope but will refer her back to her previous neurologist for potential further workup.  Will also place referral to cardiology for further outpatient evaluation. Pt denies chest pain, SOB, palpitations on initial and multiple repeat assessments.  Strict ED return precautions given, all questions answered, and stable for  discharge.  Discussed with patient that she should not drive until cleared by outpatient team.  Patient expressed understanding of plan.   Clinical Impression:  1. Syncope, unspecified syncope type   2. Injury of head, initial encounter   3. Hypokalemia      Discharge           Final Clinical Impression(s) / ED Diagnoses  Final diagnoses:  Syncope, unspecified syncope type  Injury of head, initial encounter  Hypokalemia    Rx / DC Orders ED Discharge Orders     None         Richardson Dopp 02/15/23 1936    Lonell Grandchild, MD 02/16/23 1512

## 2023-02-15 NOTE — ED Notes (Signed)
POC Preg was negative 

## 2023-02-16 ENCOUNTER — Encounter: Payer: Self-pay | Admitting: Psychiatry

## 2023-02-22 ENCOUNTER — Ambulatory Visit (INDEPENDENT_AMBULATORY_CARE_PROVIDER_SITE_OTHER): Payer: BC Managed Care – PPO

## 2023-02-22 DIAGNOSIS — J309 Allergic rhinitis, unspecified: Secondary | ICD-10-CM | POA: Diagnosis not present

## 2023-02-26 ENCOUNTER — Telehealth: Payer: Self-pay | Admitting: Allergy & Immunology

## 2023-02-26 NOTE — Telephone Encounter (Signed)
Alinda Money called from CT department at [facility] had some questions on the order that was requested for the patient. Call back number [phone]

## 2023-02-26 NOTE — Telephone Encounter (Signed)
Called [facility] CT dept - spoke to [place] - DOB verified - stated he did not see any discrepancies regarding the CT order. Drue Stager stated he would leave a message for Sheralyn Boatman to contact the office tomorrow, Wednesday, 02/27/23.

## 2023-02-27 ENCOUNTER — Ambulatory Visit (HOSPITAL_BASED_OUTPATIENT_CLINIC_OR_DEPARTMENT_OTHER)
Admission: RE | Admit: 2023-02-27 | Discharge: 2023-02-27 | Disposition: A | Discharge status: Home / Self Care | Payer: BC Managed Care – PPO | Source: Ambulatory Visit | Attending: Allergy & Immunology | Admitting: Allergy & Immunology

## 2023-02-27 ENCOUNTER — Other Ambulatory Visit: Payer: Self-pay

## 2023-02-27 ENCOUNTER — Encounter (HOSPITAL_BASED_OUTPATIENT_CLINIC_OR_DEPARTMENT_OTHER): Payer: Self-pay

## 2023-02-27 DIAGNOSIS — R55 Syncope and collapse: Secondary | ICD-10-CM

## 2023-02-27 DIAGNOSIS — R519 Headache, unspecified: Secondary | ICD-10-CM | POA: Insufficient documentation

## 2023-02-27 MED ORDER — IOHEXOL 350 MG/ML SOLN
100.0000 mL | Freq: Once | INTRAVENOUS | Status: AC | PRN
  Administered 2023-02-27: 75 mL via INTRAVENOUS

## 2023-02-27 NOTE — Telephone Encounter (Signed)
Correct CT order has been put in.

## 2023-03-01 ENCOUNTER — Ambulatory Visit (INDEPENDENT_AMBULATORY_CARE_PROVIDER_SITE_OTHER): Payer: BC Managed Care – PPO

## 2023-03-01 DIAGNOSIS — J309 Allergic rhinitis, unspecified: Secondary | ICD-10-CM | POA: Diagnosis not present

## 2023-03-06 ENCOUNTER — Encounter: Payer: Self-pay | Admitting: Allergy & Immunology

## 2023-03-15 ENCOUNTER — Ambulatory Visit (INDEPENDENT_AMBULATORY_CARE_PROVIDER_SITE_OTHER): Payer: BC Managed Care – PPO

## 2023-03-15 DIAGNOSIS — J309 Allergic rhinitis, unspecified: Secondary | ICD-10-CM

## 2023-03-22 ENCOUNTER — Ambulatory Visit (INDEPENDENT_AMBULATORY_CARE_PROVIDER_SITE_OTHER): Payer: BC Managed Care – PPO

## 2023-03-22 DIAGNOSIS — J309 Allergic rhinitis, unspecified: Secondary | ICD-10-CM | POA: Diagnosis not present

## 2023-03-29 ENCOUNTER — Ambulatory Visit (INDEPENDENT_AMBULATORY_CARE_PROVIDER_SITE_OTHER): Payer: BC Managed Care – PPO

## 2023-03-29 DIAGNOSIS — J309 Allergic rhinitis, unspecified: Secondary | ICD-10-CM | POA: Diagnosis not present

## 2023-04-05 ENCOUNTER — Ambulatory Visit (INDEPENDENT_AMBULATORY_CARE_PROVIDER_SITE_OTHER): Payer: BC Managed Care – PPO

## 2023-04-05 DIAGNOSIS — J309 Allergic rhinitis, unspecified: Secondary | ICD-10-CM | POA: Diagnosis not present

## 2023-04-12 ENCOUNTER — Ambulatory Visit (INDEPENDENT_AMBULATORY_CARE_PROVIDER_SITE_OTHER): Payer: BC Managed Care – PPO

## 2023-04-12 DIAGNOSIS — J309 Allergic rhinitis, unspecified: Secondary | ICD-10-CM | POA: Diagnosis not present

## 2023-04-16 ENCOUNTER — Other Ambulatory Visit: Payer: Self-pay | Admitting: *Deleted

## 2023-04-19 ENCOUNTER — Ambulatory Visit: Payer: BC Managed Care – PPO | Attending: Internal Medicine | Admitting: Internal Medicine

## 2023-04-19 ENCOUNTER — Other Ambulatory Visit: Payer: Self-pay | Admitting: Internal Medicine

## 2023-04-19 ENCOUNTER — Encounter: Payer: Self-pay | Admitting: *Deleted

## 2023-04-19 ENCOUNTER — Encounter: Payer: Self-pay | Admitting: Internal Medicine

## 2023-04-19 ENCOUNTER — Ambulatory Visit (INDEPENDENT_AMBULATORY_CARE_PROVIDER_SITE_OTHER): Payer: BC Managed Care – PPO

## 2023-04-19 ENCOUNTER — Ambulatory Visit (HOSPITAL_COMMUNITY): Payer: BC Managed Care – PPO

## 2023-04-19 ENCOUNTER — Other Ambulatory Visit: Payer: BC Managed Care – PPO

## 2023-04-19 VITALS — Ht 66.0 in | Wt 201.0 lb

## 2023-04-19 DIAGNOSIS — R55 Syncope and collapse: Secondary | ICD-10-CM

## 2023-04-19 DIAGNOSIS — J309 Allergic rhinitis, unspecified: Secondary | ICD-10-CM

## 2023-04-19 NOTE — Patient Instructions (Addendum)
Medication Instructions:  Your physician recommends that you continue on your current medications as directed. Please refer to the Current Medication list given to you today.  Labwork: none  Testing/Procedures: Your physician has requested that you have an echocardiogram. Echocardiography is a painless test that uses sound waves to create images of your heart. It provides your doctor with information about the size and shape of your heart and how well your heart's chambers and valves are working. This procedure takes approximately one hour. There are no restrictions for this procedure. Please do NOT wear cologne, perfume, aftershave, or lotions (deodorant is allowed). Please arrive 15 minutes prior to your appointment time. Your physician has requested that you have an exercise tolerance test. For further information please visit [email or web address] Please also follow instruction sheet, as given. ZIO-XT Long Term Monitor Instructions   Your physician has requested you wear your ZIO patch monitor 14 days.   This is a single patch monitor.  [employer] supplies one patch monitor per enrollment.  Additional stickers are not available.   Please do not apply patch if you will be having a Nuclear Stress Test, Echocardiogram, Cardiac CT, MRI, or Chest Xray during the time frame you would be wearing the monitor. The patch cannot be worn during these tests.  You cannot remove and re-apply the ZIO XT patch monitor.   Your ZIO patch monitor will be sent USPS Priority mail from [facility] directly to your home address. The monitor may also be mailed to a PO BOX if home delivery is not available.   It may take 3-5 days to receive your monitor after you have been enrolled.   Once you have received you monitor, please review enclosed instructions.  Your monitor has already been registered assigning a specific monitor serial # to you.   Applying the monitor   Shave hair from upper left chest.    Hold abrader disc by orange tab.  Rub abrader in 40 strokes over left upper chest as indicated in your monitor instructions.   Clean area with 4 enclosed alcohol pads .  Use all pads to assure are is cleaned thoroughly.  Let dry.   Apply patch as indicated in monitor instructions.  Patch will be place under collarbone on left side of chest with arrow pointing upward.   Rub patch adhesive wings for 4 minutes.Remove white label marked "1".  Remove white label marked "2".  Rub patch adhesive wings for 4 additional minutes.   While looking in a mirror, press and release button in center of patch.  A small green light will flash 3-4 times .  This will be your only indicator the monitor has been turned on.     Do not shower for the first 48 hours.  You may shower after the first 48 hours.   Press button if you feel a symptom. You will hear a small click.  Record Date, Time and Symptom in the Patient Log Book.   When you are ready to remove patch, follow instructions on last 2 pages of Patient Log Book.  Stick patch monitor onto last page of Patient Log Book.   Place Patient Log Book in [place] box.  Use locking tab on box and tape box closed securely.  The [employer] and [employer] has [employer] on it.  Please place in mailbox as soon as possible.  Your physician should have your test results approximately 7 days after the monitor has been mailed back to [facility].  Call [facility] Customer Care at [phone] if you have questions regarding your ZIO AT patch monitor.  Call them immediately if you see an orange light blinking on your monitor.   If your monitor falls off in less than 4 days contact our Monitor department at [phone].  If your monitor becomes loose or falls off after 4 days call [employer] at [phone] for suggestions on securing your monitor.  Follow-Up: Your physician recommends that you schedule a follow-up appointment in: pending  Any Other Special Instructions  Will Be Listed Below (If Applicable).  If you need a refill on your cardiac medications before your next appointment, please call your pharmacy.

## 2023-04-19 NOTE — Progress Notes (Signed)
Cardiology Office Note  Date: 04/19/2023   ID: Shatoya, Fink 23-Feb-1991, MRN [id number]  PCP:  System, Provider Not In  Cardiologist:  Marjo Bicker, MD Electrophysiologist:  None   Reason for Office Visit: No chief complaint on file.   History of Present Illness: Kelsey Craig is a 32 y.o. female known to have migraines was referred to cardiology clinic for evaluation of syncope.  Patient had 6 syncopal episodes since 01/2023. She usually feels a feeling of hot rushing into her head followed by syncopal episode.  No postictal confusion.  The syncopal episodes always occurred in her home. She is undergoing a divorce right now. But she says it has no correlation with the timing of syncopal episodes.  No symptoms of chest pain, DOE, dizziness, palpitations.  No dizziness or palpitations prior to syncopal episode.  Past Medical History:  Diagnosis Date   Gestational hypertension 2020   Hepatitis C    History of alcohol abuse    History of intravenous drug abuse    Migraine    Syncope and collapse     Past Surgical History:  Procedure Laterality Date   BREAST MASS EXCISION     BREAST SURGERY     CHEILECTOMY Right 04/18/2022   Procedure: CHEILECTOMY RIGHT HALLUX;  Surgeon: Erskine Emery, DPM;  Location: [facility];  Service: Podiatry;  Laterality: Right;   TONSILLECTOMY      Current Outpatient Medications  Medication Sig Dispense Refill   cetirizine (ZYRTEC) 10 MG tablet Take 10 mg by mouth daily.     fluticasone (FLONASE) 50 MCG/ACT nasal spray Place 1 spray into both nostrils daily as needed for allergies or rhinitis.     Multiple Vitamin (MULTIVITAMIN WITH MINERALS) TABS tablet Take 1 tablet by mouth daily.     rizatriptan (MAXALT) 10 MG tablet Take 1 tablet (10 mg total) by mouth as needed for migraine. May repeat in 2 hours if needed 10 tablet 6   No current facility-administered medications for this visit.   Allergies:  Latex   Social History:  The patient  reports that she has quit smoking. She has never used smokeless tobacco. She reports that she does not currently use alcohol. She reports that she does not currently use drugs.   Family History: The patient's family history includes Diabetes in her father; Hypertension in her mother.   ROS:  Please see the history of present illness. Otherwise, complete review of systems is positive for none.  All other systems are reviewed and negative.   Physical Exam: VS:  Ht 5\' 6"  (1.676 m)   Wt 201 lb (91.2 kg)   BMI 32.44 kg/m , BMI Body mass index is 32.44 kg/m.  Wt Readings from Last 3 Encounters:  04/19/23 201 lb (91.2 kg)  02/15/23 200 lb (90.7 kg)  11/16/22 206 lb (93.4 kg)    General: Patient appears comfortable at rest. HEENT: Conjunctiva and lids normal, oropharynx clear with moist mucosa. Neck: Supple, no elevated JVP or carotid bruits, no thyromegaly. Lungs: Clear to auscultation, nonlabored breathing at rest. Cardiac: Regular rate and rhythm, no S3 or significant systolic murmur, no pericardial rub. Abdomen: Soft, nontender, no hepatomegaly, bowel sounds present, no guarding or rebound. Extremities: No pitting edema, distal pulses 2+. Skin: Warm and dry. Musculoskeletal: No kyphosis. Neuropsychiatric: Alert and oriented x3, affect grossly appropriate.  Recent Labwork: 02/15/2023: BUN 12; Creatinine, Ser 0.69; Hemoglobin 13.6; Platelets 291; Potassium 3.3; Sodium 134  No results found for: "CHOL", "TRIG", "HDL", "  CHOLHDL", "VLDL", "LDLCALC", "LDLDIRECT"  Other Studies Reviewed Today:   Assessment and Plan:  # Syncope likely vasovagal, rule out cardiac -Obtain exercise tolerance test to rule out any exertional arrhythmias. Obtain 2D echocardiogram to rule out structural heart disease. Obtain 2-week event monitor,live to rule out any conduction system abnormalities/arrhythmias. Not safe to drive.  I have spent a total of 30 minutes with patient reviewing chart, EKGs,  labs and examining patient as well as establishing an assessment and plan that was discussed with the patient.  > 50% of time was spent in direct patient care.    Medication Adjustments/Labs and Tests Ordered: Current medicines are reviewed at length with the patient today.  Concerns regarding medicines are outlined above.   Tests Ordered: Orders Placed This Encounter  Procedures   EXERCISE TOLERANCE TEST (ETT)   ECHOCARDIOGRAM COMPLETE    Medication Changes: No orders of the defined types were placed in this encounter.   Disposition:  Follow up  pending results  Signed, Paytyn Mesta Verne Spurr, MD, 04/19/2023 4:48 PM    [facility] at [facility] [address], [place], [place] [address]

## 2023-04-26 ENCOUNTER — Telehealth: Payer: Self-pay | Admitting: Nurse Practitioner

## 2023-04-26 ENCOUNTER — Ambulatory Visit: Payer: BC Managed Care – PPO | Admitting: Internal Medicine

## 2023-04-26 NOTE — Telephone Encounter (Signed)
Checking percert on the following patient for testing scheduled at [facility].     Patient was scheduled for Echo on 04-29-2023 [facility]. Percert has been checked but we have moved her location to [facility] for 06-07-2023 Checking to see if Location is a factor with percert.  Thank you.

## 2023-04-29 ENCOUNTER — Other Ambulatory Visit: Payer: BC Managed Care – PPO

## 2023-05-03 ENCOUNTER — Ambulatory Visit (INDEPENDENT_AMBULATORY_CARE_PROVIDER_SITE_OTHER): Payer: BC Managed Care – PPO

## 2023-05-03 ENCOUNTER — Ambulatory Visit (HOSPITAL_COMMUNITY): Payer: BC Managed Care – PPO | Attending: Internal Medicine

## 2023-05-03 DIAGNOSIS — J309 Allergic rhinitis, unspecified: Secondary | ICD-10-CM

## 2023-05-10 ENCOUNTER — Ambulatory Visit (INDEPENDENT_AMBULATORY_CARE_PROVIDER_SITE_OTHER): Payer: BC Managed Care – PPO

## 2023-05-10 DIAGNOSIS — J309 Allergic rhinitis, unspecified: Secondary | ICD-10-CM

## 2023-05-18 ENCOUNTER — Ambulatory Visit
Admission: RE | Admit: 2023-05-18 | Discharge: 2023-05-18 | Disposition: A | Discharge status: Home / Self Care | Payer: BC Managed Care – PPO | Source: Ambulatory Visit | Attending: Family Medicine | Admitting: Family Medicine

## 2023-05-18 VITALS — BP 115/77 | HR 88 | Temp 99.0°F | Resp 18

## 2023-05-18 DIAGNOSIS — J029 Acute pharyngitis, unspecified: Secondary | ICD-10-CM

## 2023-05-18 DIAGNOSIS — U071 COVID-19: Secondary | ICD-10-CM | POA: Diagnosis not present

## 2023-05-18 LAB — POCT RAPID STREP A (OFFICE): Rapid Strep A Screen: NEGATIVE

## 2023-05-18 MED ORDER — LIDOCAINE VISCOUS HCL 2 % MT SOLN: 10.0000 mL | OROMUCOSAL | 0 refills | Status: DC | PRN

## 2023-05-18 NOTE — ED Triage Notes (Addendum)
Pt c/o sore throat , low grade fever, and ear discomfort.   Home interventions: tylenol (last taken around 0830), motrin   Started: yesterday

## 2023-05-18 NOTE — ED Provider Notes (Signed)
[facility]    CSN: [id number] Arrival date & time: 05/18/23  4098      History   Chief Complaint Chief Complaint  Patient presents with   Sore Throat    Entered by patient    HPI Kelsey Craig is a 32 y.o. female.   Presenting today with 1 day history of low-grade fever, sore throat, ear discomfort.  Denies cough, congestion, chest pain, shortness of breath, abdominal pain, nausea vomiting or diarrhea.  So far trying Tylenol and Motrin with minimal relief.  Son sick with similar symptoms.    Past Medical History:  Diagnosis Date   Gestational hypertension 2020   Hepatitis C    History of alcohol abuse    History of intravenous drug abuse    Migraine    Syncope and collapse     Patient Active Problem List   Diagnosis Date Noted   Syncope and collapse 04/19/2023   Gestational hypertension 09/29/2019   Encounter for planned induction of labor 09/28/2019   SVD  03/29/2018   Postpartum care following vaginal delivery (12/8) 03/29/2018   Second-degree perineal laceration, with delivery 03/29/2018   Chronic hepatitis C affecting pregnancy, antepartum (HCC) 02/20/2018    Past Surgical History:  Procedure Laterality Date   BREAST MASS EXCISION     BREAST SURGERY     CHEILECTOMY Right 04/18/2022   Procedure: CHEILECTOMY RIGHT HALLUX;  Surgeon: Erskine Emery, DPM;  Location: [facility];  Service: Podiatry;  Laterality: Right;   TONSILLECTOMY      OB History     Gravida  2   Para  2   Term  2   Preterm      AB      Living  2      SAB      IAB      Ectopic      Multiple  0   Live Births  2            Home Medications    Prior to Admission medications   Medication Sig Start Date End Date Taking? Authorizing Provider  lidocaine (XYLOCAINE) 2 % solution Use as directed 10 mLs in the mouth or throat every 3 (three) hours as needed. 05/18/23  Yes Particia Nearing, PA-C  cetirizine (ZYRTEC) 10 MG tablet Take 10 mg by  mouth daily. 03/20/23   [provider]  fluticasone (FLONASE) 50 MCG/ACT nasal spray Place 1 spray into both nostrils daily as needed for allergies or rhinitis.    [provider]  Multiple Vitamin (MULTIVITAMIN WITH MINERALS) TABS tablet Take 1 tablet by mouth daily.    [provider]  rizatriptan (MAXALT) 10 MG tablet Take 1 tablet (10 mg total) by mouth as needed for migraine. May repeat in 2 hours if needed 04/02/22   Ocie Doyne, MD    Family History Family History  Problem Relation Age of Onset   Hypertension Mother    Diabetes Father     Social History Social History   Tobacco Use   Smoking status: Former   Smokeless tobacco: Never  Advertising account planner   Vaping status: Never Used  Substance Use Topics   Alcohol use: Not Currently    Comment: last use 2023   Drug use: Not Currently    Comment: hx IV drug use and alcohol abuse     Allergies   Latex   Review of Systems Review of Systems Per HPI  Physical Exam Triage Vital Signs ED  Triage Vitals  Encounter Vitals Group     BP 05/18/23 1010 115/77     Systolic BP Percentile --      Diastolic BP Percentile --      Pulse Rate 05/18/23 1010 88     Resp 05/18/23 1010 18     Temp 05/18/23 1010 99 F (37.2 C)     Temp Source 05/18/23 1010 Oral     SpO2 05/18/23 1010 97 %     Weight --      Height --      Head Circumference --      Peak Flow --      Pain Score 05/18/23 1011 5     Pain Loc --      Pain Education --      Exclude from Growth Chart --    No data found.  Updated Vital Signs BP 115/77 (BP Location: Right Arm)   Pulse 88   Temp 99 F (37.2 C) (Oral)   Resp 18   LMP 04/27/2023   SpO2 97%   Visual Acuity Right Eye Distance:   Left Eye Distance:   Bilateral Distance:    Right Eye Near:   Left Eye Near:    Bilateral Near:     Physical Exam Vitals and nursing note reviewed.  Constitutional:      Appearance: Normal appearance.  HENT:     Head: Atraumatic.      Right Ear: Tympanic membrane and external ear normal.     Left Ear: Tympanic membrane and external ear normal.     Nose: Nose normal.     Mouth/Throat:     Mouth: Mucous membranes are moist.     Pharynx: Posterior oropharyngeal erythema present. No oropharyngeal exudate.  Eyes:     Extraocular Movements: Extraocular movements intact.     Conjunctiva/sclera: Conjunctivae normal.  Cardiovascular:     Rate and Rhythm: Normal rate and regular rhythm.     Heart sounds: Normal heart sounds.  Pulmonary:     Effort: Pulmonary effort is normal.     Breath sounds: Normal breath sounds. No wheezing.  Musculoskeletal:        General: Normal range of motion.     Cervical back: Normal range of motion and neck supple.  Lymphadenopathy:     Cervical: No cervical adenopathy.  Skin:    General: Skin is warm and dry.  Neurological:     Mental Status: She is alert and oriented to person, place, and time.  Psychiatric:        Mood and Affect: Mood normal.        Thought Content: Thought content normal.      UC Treatments / Results  Labs (all labs ordered are listed, but only abnormal results are displayed) Labs Reviewed  CULTURE, GROUP A STREP ([facility])  SARS CORONAVIRUS 2 (TAT 6-24 HRS)  POCT RAPID STREP A (OFFICE)    EKG   Radiology No results found.  Procedures Procedures (including critical care time)  Medications Ordered in UC Medications - No data to display  Initial Impression / Assessment and Plan / UC Course  I have reviewed the triage vital signs and the nursing notes.  Pertinent labs & imaging results that were available during my care of the patient were reviewed by me and considered in my medical decision making (see chart for details).     Vitals and exam reassuring, rapid strep negative, throat culture and COVID testing pending.  Discussed viscous lidocaine,  supportive over-the-counter medications and home care while awaiting additional results and will adjust if  needed.  Return for worsening symptoms.  Final Clinical Impressions(s) / UC Diagnoses   Final diagnoses:  Acute pharyngitis, unspecified etiology     Discharge Instructions      Your strep test was negative today, I have sent out for a throat culture and COVID test for further evaluation.  I have also sent over some lidocaine for you to gargle to help numb your throat for pain relief.  You may take over-the-counter pain relievers, throat lozenges, use salt water gargles.    ED Prescriptions     Medication Sig Dispense Auth. Provider   lidocaine (XYLOCAINE) 2 % solution Use as directed 10 mLs in the mouth or throat every 3 (three) hours as needed. 100 mL Particia Nearing, [place]      PDMP not reviewed this encounter.   Particia Nearing, [place] 05/18/23 1050

## 2023-05-18 NOTE — Discharge Instructions (Signed)
Your strep test was negative today, I have sent out for a throat culture and COVID test for further evaluation.  I have also sent over some lidocaine for you to gargle to help numb your throat for pain relief.  You may take over-the-counter pain relievers, throat lozenges, use salt water gargles.

## 2023-05-19 LAB — SARS CORONAVIRUS 2 (TAT 6-24 HRS): SARS Coronavirus 2: POSITIVE — AB

## 2023-05-21 LAB — CULTURE, GROUP A STREP (THRC)

## 2023-05-24 ENCOUNTER — Ambulatory Visit: Payer: Self-pay

## 2023-05-24 DIAGNOSIS — J309 Allergic rhinitis, unspecified: Secondary | ICD-10-CM

## 2023-06-07 ENCOUNTER — Ambulatory Visit (HOSPITAL_COMMUNITY): Admission: RE | Admit: 2023-06-07 | Payer: BC Managed Care – PPO | Source: Ambulatory Visit

## 2023-06-28 ENCOUNTER — Ambulatory Visit (INDEPENDENT_AMBULATORY_CARE_PROVIDER_SITE_OTHER): Payer: BC Managed Care – PPO

## 2023-06-28 DIAGNOSIS — J309 Allergic rhinitis, unspecified: Secondary | ICD-10-CM | POA: Diagnosis not present

## 2023-07-05 ENCOUNTER — Ambulatory Visit (INDEPENDENT_AMBULATORY_CARE_PROVIDER_SITE_OTHER): Payer: BC Managed Care – PPO

## 2023-07-05 DIAGNOSIS — J309 Allergic rhinitis, unspecified: Secondary | ICD-10-CM

## 2023-07-12 ENCOUNTER — Ambulatory Visit (INDEPENDENT_AMBULATORY_CARE_PROVIDER_SITE_OTHER): Payer: Self-pay

## 2023-07-12 DIAGNOSIS — J309 Allergic rhinitis, unspecified: Secondary | ICD-10-CM | POA: Diagnosis not present

## 2023-07-13 IMAGING — CT CT HEAD W/O CM
3 series · 15 of 46 positions shown, 18 images · non-contrast
Comparison: None.

CLINICAL DATA: Syncope.  Fell and hit floor.

EXAM:
CT HEAD WITHOUT CONTRAST
TECHNIQUE: Contiguous axial images were obtained from the base of the skull
through the vertex without intravenous contrast.

[Series 2: head w o · axial · 0.44mm/px · z∈[+54,+174]mm · 9 of 29 slices shown, 12 images]
[im 3/29  brain]
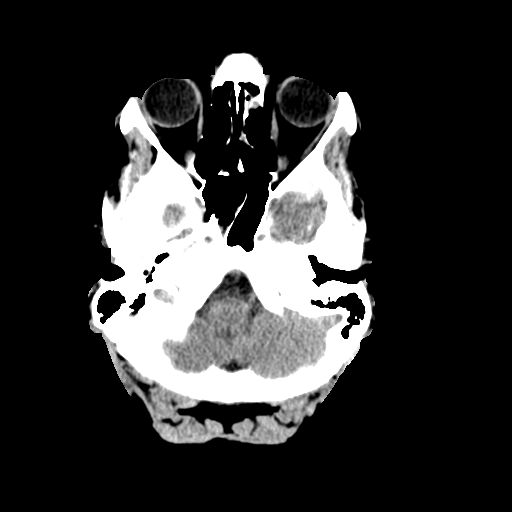
[im 3/29  bone]
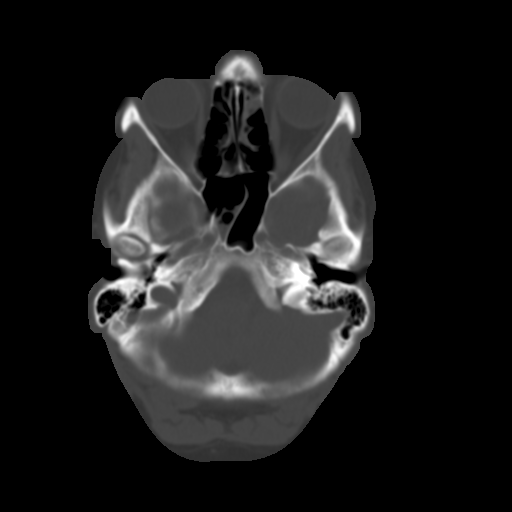
[im 6/29  brain]
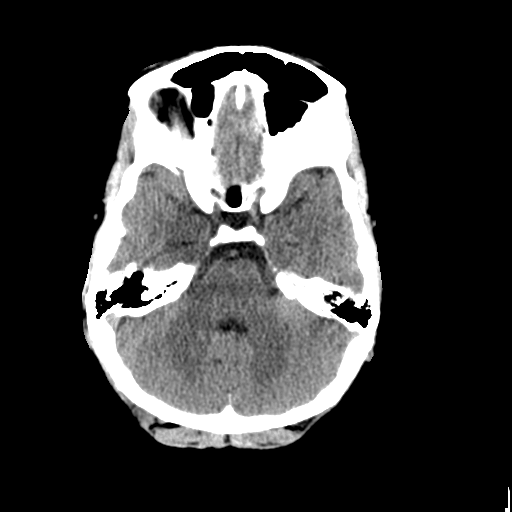
[im 9/29  brain]
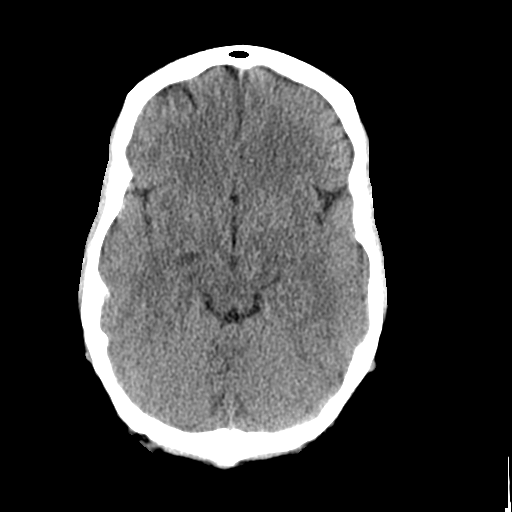
[im 12/29  brain]
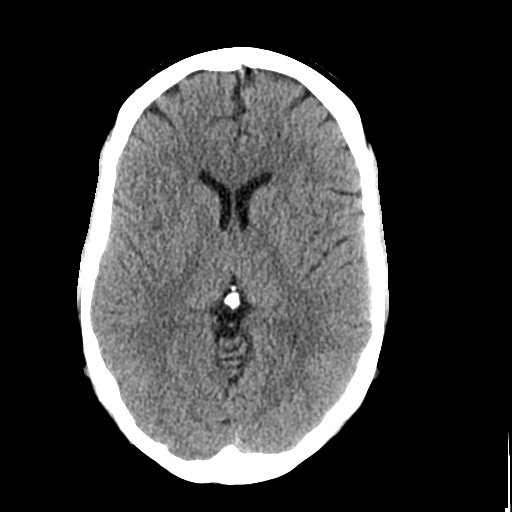
[im 15/29  brain]
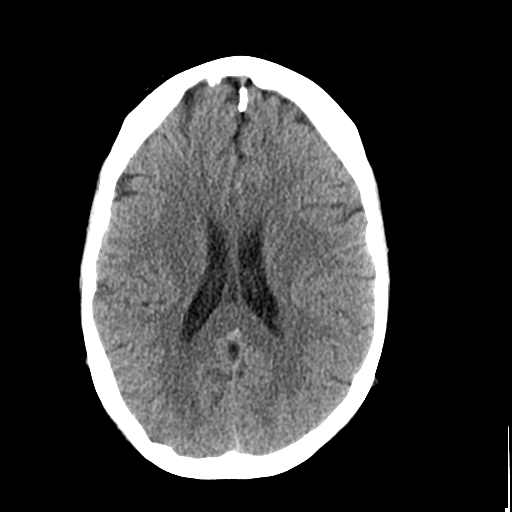
[im 15/29  bone]
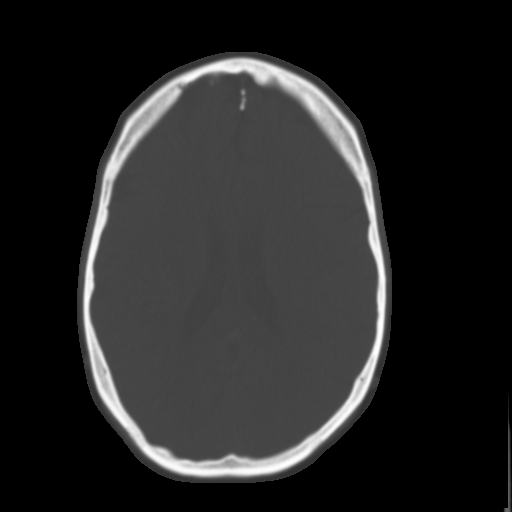
[im 18/29  brain]
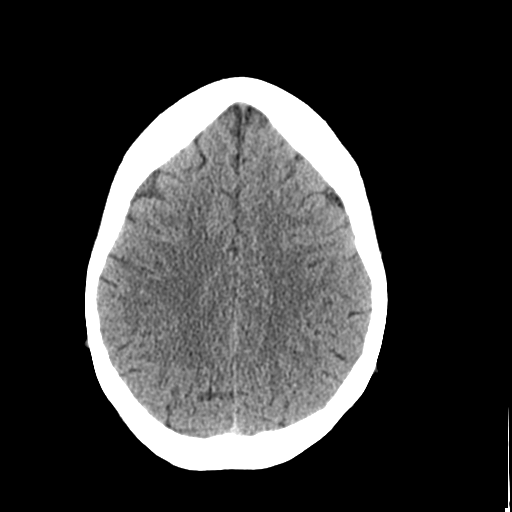
[im 21/29  brain]
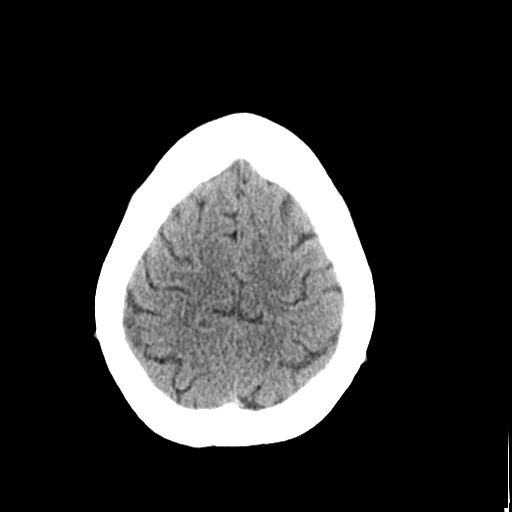
[im 24/29  brain]
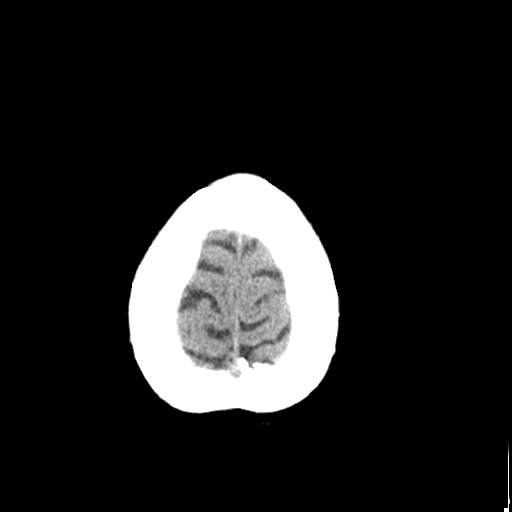
[im 27/29  brain]
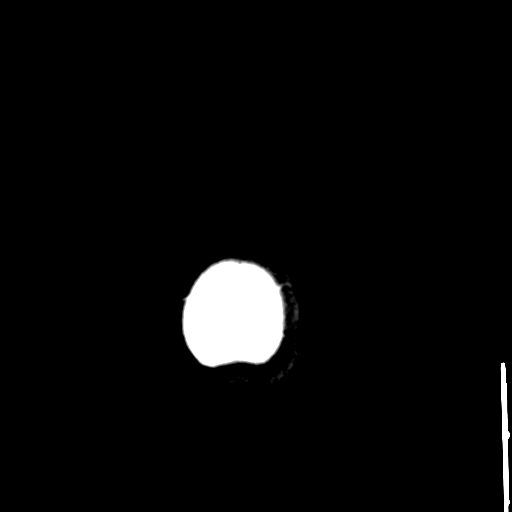
[im 27/29  bone]
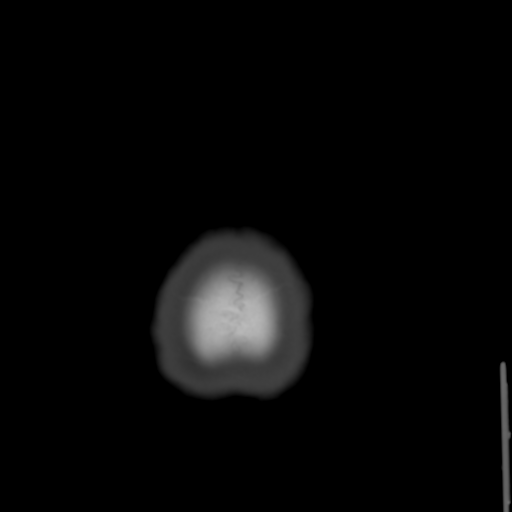

[Series 4: coronal soft · coronal · 0.30mm/px · 3 of 67 slices shown]
[im 23/67  brain]
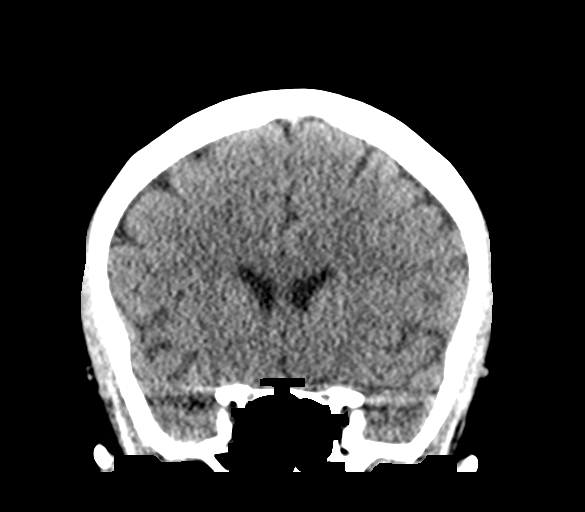
[im 30/67  brain]
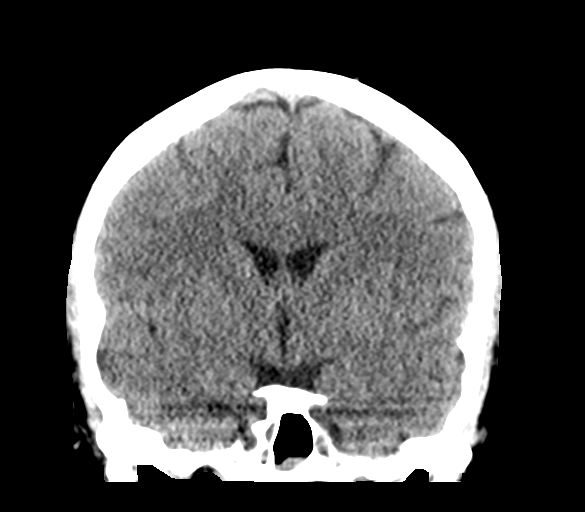
[im 37/67  brain]
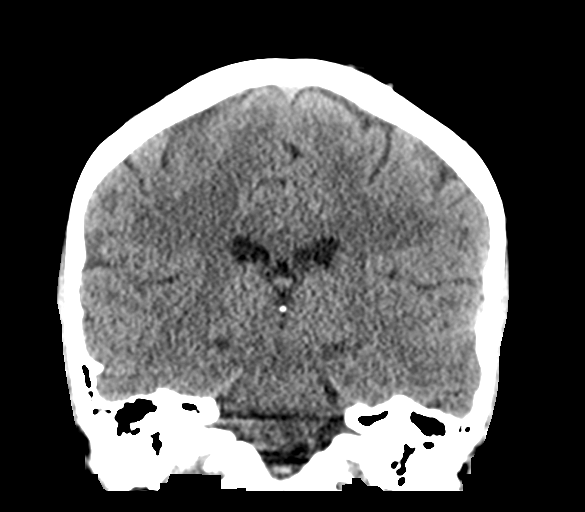

[Series 5: sagittal soft · sagittal · 0.30mm/px · 3 of 52 slices shown]
[im 18/52  brain]
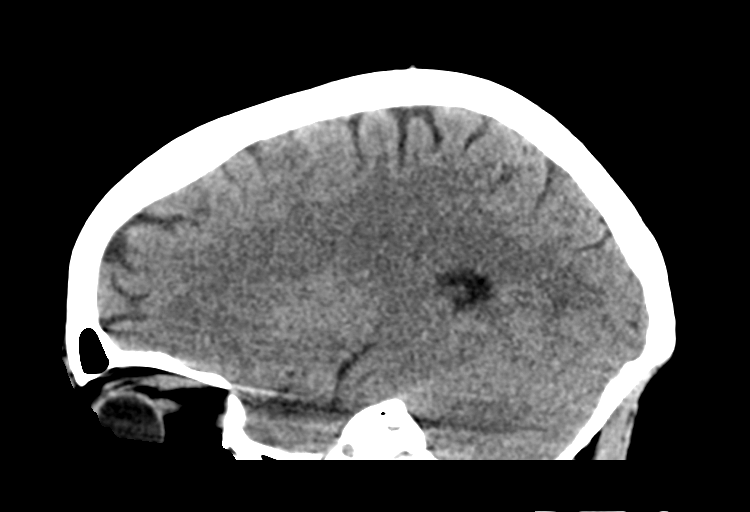
[im 26/52  brain]
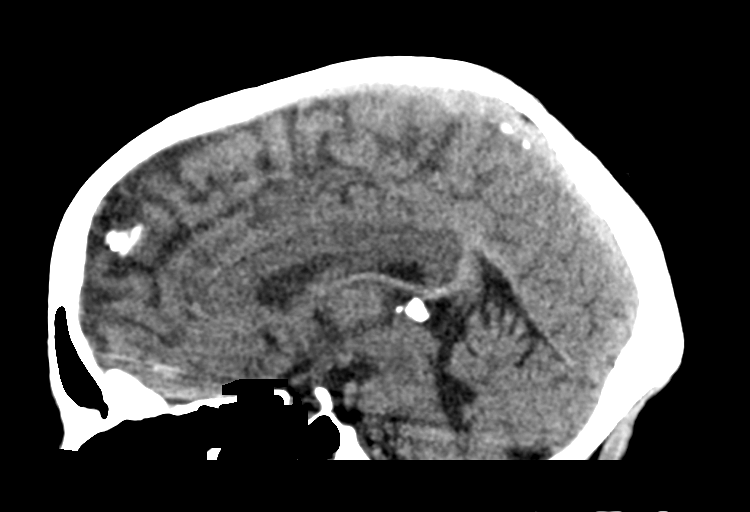
[im 35/52  brain]
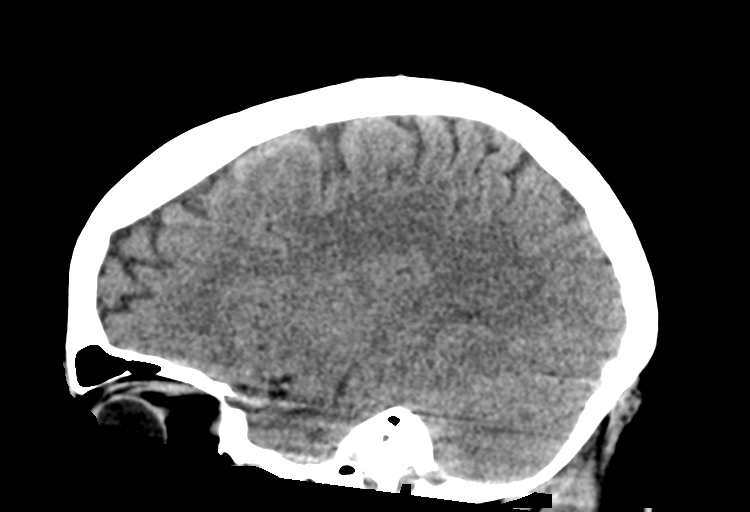

[15 of 46 positions shown; findings below may reference images not displayed]

FINDINGS: Brain: There is no evidence for acute hemorrhage, hydrocephalus,
mass lesion, or abnormal extra-axial fluid collection. No definite
CT evidence for acute infarction.

Vascular: No hyperdense vessel or unexpected calcification.

Skull: No evidence for fracture. No worrisome lytic or sclerotic
lesion.

Sinuses/Orbits: Mucosal thickening noted sphenoid sinuses. Remaining
paranasal sinuses and mastoid air cells are clear. Visualized
portions of the globes and intraorbital fat are unremarkable.

Other: None.
IMPRESSION: 1. No acute intracranial abnormality.
2. Chronic sphenoid sinus disease.

## 2023-07-26 ENCOUNTER — Ambulatory Visit (INDEPENDENT_AMBULATORY_CARE_PROVIDER_SITE_OTHER): Payer: BC Managed Care – PPO

## 2023-07-26 DIAGNOSIS — J309 Allergic rhinitis, unspecified: Secondary | ICD-10-CM | POA: Diagnosis not present

## 2023-08-01 DIAGNOSIS — J3081 Allergic rhinitis due to animal (cat) (dog) hair and dander: Secondary | ICD-10-CM | POA: Diagnosis not present

## 2023-08-01 NOTE — Progress Notes (Signed)
VIALS EXP 07-31-24

## 2023-08-02 ENCOUNTER — Ambulatory Visit (INDEPENDENT_AMBULATORY_CARE_PROVIDER_SITE_OTHER): Payer: BC Managed Care – PPO

## 2023-08-02 ENCOUNTER — Ambulatory Visit: Payer: Self-pay

## 2023-08-02 DIAGNOSIS — J309 Allergic rhinitis, unspecified: Secondary | ICD-10-CM | POA: Diagnosis not present

## 2023-08-02 DIAGNOSIS — E889 Metabolic disorder, unspecified: Secondary | ICD-10-CM

## 2023-08-09 ENCOUNTER — Ambulatory Visit (INDEPENDENT_AMBULATORY_CARE_PROVIDER_SITE_OTHER): Payer: BC Managed Care – PPO

## 2023-08-09 DIAGNOSIS — J309 Allergic rhinitis, unspecified: Secondary | ICD-10-CM | POA: Diagnosis not present

## 2023-08-09 NOTE — Progress Notes (Signed)
Immunotherapy   Patient Details  Name: Kelsey Craig MRN: [id number] Date of Birth: 04-Dec-1990  08/09/2023  [name] here to pick up  red vials  Following schedule: A  Frequency:1 time per week Epi-Pen:Epi-Pen Available  Consent signed previously and patient instructions given. Patient was given paperwork to give to her provider to continue immunotherapy.   Ralene Muskrat 08/09/2023, 9:27 AM

## 2023-09-27 ENCOUNTER — Ambulatory Visit (INDEPENDENT_AMBULATORY_CARE_PROVIDER_SITE_OTHER): Payer: BC Managed Care – PPO

## 2023-09-27 DIAGNOSIS — J309 Allergic rhinitis, unspecified: Secondary | ICD-10-CM | POA: Diagnosis not present

## 2023-09-27 NOTE — Progress Notes (Signed)
Immunotherapy   Patient Details  Name: Nandhini Shwartz MRN: [id number] Date of Birth: 1991/04/11  09/27/2023  [name] here to pick up  her red vials of grasses, weeds, trees, cats, dogs,  Molds, cockroaches, and dust mites.  Following schedule: A  Frequency:1 time per week Epi-Pen:Epi-Pen Available  Consent signed and patient instructions given.   Ralene Muskrat 09/27/2023, 9:34 AM

## 2023-11-01 ENCOUNTER — Encounter: Payer: Self-pay | Admitting: Allergy & Immunology

## 2023-11-01 ENCOUNTER — Ambulatory Visit (INDEPENDENT_AMBULATORY_CARE_PROVIDER_SITE_OTHER): Payer: BC Managed Care – PPO | Admitting: Allergy & Immunology

## 2023-11-01 VITALS — BP 110/82 | HR 70 | Temp 97.6°F | Resp 16 | Ht 66.0 in | Wt 200.0 lb

## 2023-11-01 DIAGNOSIS — L500 Allergic urticaria: Secondary | ICD-10-CM

## 2023-11-01 DIAGNOSIS — R55 Syncope and collapse: Secondary | ICD-10-CM

## 2023-11-01 DIAGNOSIS — R519 Headache, unspecified: Secondary | ICD-10-CM

## 2023-11-01 DIAGNOSIS — J3089 Other allergic rhinitis: Secondary | ICD-10-CM

## 2023-11-01 DIAGNOSIS — R0981 Nasal congestion: Secondary | ICD-10-CM

## 2023-11-01 DIAGNOSIS — J302 Other seasonal allergic rhinitis: Secondary | ICD-10-CM

## 2023-11-01 MED ORDER — LEVOCETIRIZINE DIHYDROCHLORIDE 5 MG PO TABS: 5.0000 mg | ORAL_TABLET | Freq: Every evening | ORAL | 3 refills | Status: AC

## 2023-11-01 MED ORDER — EPINEPHRINE 0.3 MG/0.3ML IJ SOAJ: 0.3000 mg | Freq: Once | INTRAMUSCULAR | 2 refills | Status: AC

## 2023-11-01 NOTE — Patient Instructions (Addendum)
 1. Seasonal and perennial allergic rhinitis - Continue with the allergy shots at the same schedule.  - Discussed Signulair and decided to hold off given the black box warning. - Let's sent you to ENT given the chronic sinusitis seen on your imaging. - Sample of Xhance  provided again (we will try sending it in).  - Alternating antihistamines as you are doing.   2. Allergic urticaria - resolved  - You seem to have this under control.  3. Syncope - stress related  - Continue to try to limit stress.  4. Return in about 6 months (around 04/30/2024). You can have the follow up appointment with Dr. Iva or a Nurse Practicioner (our Nurse Practitioners are excellent and always have Physician oversight!).    Please inform us  of any Emergency Department visits, hospitalizations, or changes in symptoms. Call us  before going to the ED for breathing or allergy symptoms since we might be able to fit you in for a sick visit. Feel free to contact us  anytime with any questions, problems, or concerns.  It was a pleasure to see you again today!  Websites that have reliable patient information: 1. American Academy of Asthma, Allergy, and Immunology: www.aaaai.org 2. Food Allergy Research and Education (FARE): foodallergy.org 3. Mothers of Asthmatics: http://www.asthmacommunitynetwork.org 4. American College of Allergy, Asthma, and Immunology: www.acaai.org      "Like" us  on Facebook and Instagram for our latest updates!      A healthy democracy works best when [employer] participate! Make sure you are registered to vote! If you have moved or changed any of your contact information, you will need to get this updated before voting! Scan the QR codes below to learn more!

## 2023-11-01 NOTE — Progress Notes (Signed)
 FOLLOW UP  Date of Service/Encounter:  11/01/23   Assessment:   Allergic urticaria - largely resolved   Seasonal and perennial allergic rhinitis - on allergy shots with maintenance reached    Syncopal episodes with headaches - with a normal head CT in October 2022   Perennial and seasonal allergic rhinitis (grasses, weeds, trees, molds, dust mites, cats, dog, cockroach) - on allergen immunotherapy with maintenance reached April 2024    Migraines - follows with Dr. Delon Nurse   Latex allergy    Martial stress  Plan/Recommendations:   1. Seasonal and perennial allergic rhinitis - Continue with the allergy shots at the same schedule.  - Discussed Signulair and decided to hold off given the black box warning. - Let's sent you to ENT given the chronic sinusitis seen on your imaging. - Sample of Xhance  provided again (we will try sending it in).  - Alternating antihistamines as you are doing.   2. Allergic urticaria - resolved  - You seem to have this under control.  3. Syncope - stress related  - Continue to try to limit stress.  4. Return in about 6 months (around 04/30/2024). You can have the follow up appointment with Dr. Iva or a Nurse Practicioner (our Nurse Practitioners are excellent and always have Physician oversight!).   Subjective:   Kelsey Craig is a 33 y.o. female presenting today for follow up of No chief complaint on file.   Kelsey Craig has a history of the following: Patient Active Problem List   Diagnosis Date Noted   Syncope and collapse 04/19/2023   Gestational hypertension 09/29/2019   Encounter for planned induction of labor 09/28/2019   SVD  03/29/2018   Postpartum care following vaginal delivery (12/8) 03/29/2018   Second-degree perineal laceration, with delivery 03/29/2018   Chronic hepatitis C affecting pregnancy, antepartum (HCC) 02/20/2018    History obtained from: chart review and patient.  Discussed the use of  AI scribe software for clinical note transcription with the patient and/or guardian, who gave verbal consent to proceed.  Kelsey Craig is a 33 y.o. female presenting for a follow up visit.  She was last seen in January 2024.  At that time, she was transitioning care from a previous allergy practice that had recently closed.  We started Ryaltris  1 spray per nostril twice daily.  We continue with cetirizine but increase to 1 tablet twice daily.  We remixed her allergy vials to increase cat and dog as well as dust mite.  She was experiencing syncope and we ordered a CT angiogram to rule out vasculature issues in her brain.  This ended up being normal.  She was already established with a neurologist.  In the interim, we received the records from her previous allergy office and she was receiving 3 injections.  We reformulated those into 2 allergy injections.  Since the last visit, she has done somewhat ok.   Allergic Rhinitis Symptom History: Kelsey Craig has been on allergy shots for over a year, and while she reports some improvement, the chronic congestion remains a significant issue. She has tried various medications, including Singulair and Xhance , with limited success. The patient is open to trying different allergy medications and is considering a referral to an ENT specialist for further evaluation.  She has failed Flonase , Nasacort , Rylantris, Nasonex and Rhincort. She has tried and failed multiple over-the-counter nasal sprays, including Nasacort , but found them to be too strong. She has also tried nasal rinses with liquid steroid mixed  into salt water, but this seemed to exacerbate the congestion, even causing discomfort in the ears. She thinks that she was on Xhance  when she was seeing her previous allergist, but it was not covered very well.   Kelsey Craig has been receiving allergy shots at her workplace once a week. She reports that she often has a bad reaction once she reaches the halfway mark of the dosage, requiring  a repeat of the dose. This has been a consistent issue since she started receiving the shots.  Kelsey Craig is on allergen immunotherapy. She receives two injections. Immunotherapy script #1 contains molds, dust mites, and cockroach. She currently receives 0.50mL of the RED vial (1/100). Immunotherapy script #2 contains trees, weeds, grasses, cat, and dog. She currently receives 0.50mL of the RED vial (1/100). She started shots April of 2024 and reached maintenance in April 2024 (we just reformulated her shots at this point to combine into two vials, therefore we started at the Red Vial for her).   In addition to the allergy issues, the patient has been dealing with migraines and has recently started taking propranolol for this. She also has a history of hives, which she believes are stress-related. The patient is currently dealing with significant personal stress, including marital issues. Her husband is a naval architect and is away for long stretches of time. She is essentially a single parent to her two boys.   Otherwise, there have been no changes to her past medical history, surgical history, family history, or social history.    Review of systems otherwise negative other than that mentioned in the HPI.    Objective:   Blood pressure 110/82, pulse 70, temperature 97.6 F (36.4 C), resp. rate 16, height 5' 6 (1.676 m), weight 200 lb (90.7 kg), SpO2 97%. Body mass index is 32.28 kg/m.    Physical Exam Vitals reviewed.  Constitutional:      Appearance: She is well-developed.     Comments: Very lovely.  Personable.  HENT:     Head: Normocephalic and atraumatic.     Comments: No nasal polyps noted.    Right Ear: Tympanic membrane, ear canal and external ear normal. No drainage, swelling or tenderness. Tympanic membrane is not injected, scarred, erythematous, retracted or bulging.     Left Ear: Tympanic membrane, ear canal and external ear normal. No drainage, swelling or tenderness. Tympanic  membrane is not injected, scarred, erythematous, retracted or bulging.     Nose: No nasal deformity, septal deviation, mucosal edema or rhinorrhea.     Right Turbinates: Enlarged, swollen and pale.     Left Turbinates: Enlarged, swollen and pale.     Right Sinus: No maxillary sinus tenderness or frontal sinus tenderness.     Left Sinus: No maxillary sinus tenderness or frontal sinus tenderness.     Comments: No polyps noted. Rhinorrhea seems to be minimal.     Mouth/Throat:     Mouth: Mucous membranes are not pale and not dry.     Pharynx: Uvula midline.  Eyes:     General:        Right eye: No discharge.        Left eye: No discharge.     Conjunctiva/sclera: Conjunctivae normal.     Right eye: Right conjunctiva is not injected. No chemosis.    Left eye: Left conjunctiva is not injected. No chemosis.    Pupils: Pupils are equal, round, and reactive to light.  Cardiovascular:     Rate and Rhythm: Normal rate  and regular rhythm.     Heart sounds: Normal heart sounds.  Pulmonary:     Effort: Pulmonary effort is normal. No tachypnea, accessory muscle usage or respiratory distress.     Breath sounds: Normal breath sounds. No wheezing, rhonchi or rales.  Chest:     Chest wall: No tenderness.  Lymphadenopathy:     Head:     Right side of head: No submandibular, tonsillar or occipital adenopathy.     Left side of head: No submandibular, tonsillar or occipital adenopathy.     Cervical: No cervical adenopathy.  Skin:    Coloration: Skin is not pale.     Findings: No abrasion, erythema, petechiae or rash. Rash is not papular, urticarial or vesicular.  Neurological:     Mental Status: She is alert.  Psychiatric:        Behavior: Behavior is cooperative.      Diagnostic studies: none       Marty Shaggy, MD  [facility] 

## 2023-11-04 MED ORDER — XHANCE 93 MCG/ACT NA EXHU: 2.0000 | INHALANT_SUSPENSION | Freq: Two times a day (BID) | NASAL | 5 refills | Status: AC

## 2023-11-04 NOTE — Addendum Note (Signed)
 Addended by: Alfonse Spruce on: 11/04/2023 07:03 AM   Modules accepted: Level of Service

## 2023-11-05 ENCOUNTER — Encounter (INDEPENDENT_AMBULATORY_CARE_PROVIDER_SITE_OTHER): Payer: Self-pay | Admitting: Otolaryngology

## 2023-11-12 ENCOUNTER — Encounter (INDEPENDENT_AMBULATORY_CARE_PROVIDER_SITE_OTHER): Payer: Self-pay | Admitting: Otolaryngology

## 2023-12-04 ENCOUNTER — Telehealth: Payer: Self-pay | Admitting: Allergy & Immunology

## 2023-12-04 NOTE — Telephone Encounter (Signed)
Patient called and needs new vials. She thinks that she has 2-3 doses of her current vials left. She is going to call in two weeks to make an appointment to get her first injection of her new vials.  She gets her injections at her workplace.   Immunotherapy Team - can you make a new set of Red Vials?   Malachi Bonds, MD [facility]

## 2023-12-05 DIAGNOSIS — J3089 Other allergic rhinitis: Secondary | ICD-10-CM | POA: Diagnosis not present

## 2023-12-05 NOTE — Progress Notes (Addendum)
VIAL 1 MADE 12/05/23.

## 2023-12-06 NOTE — Progress Notes (Signed)
VIAL 2 MADE 12-06-23. EXP 12-05-24.

## 2024-01-03 ENCOUNTER — Ambulatory Visit: Payer: BC Managed Care – PPO

## 2024-01-03 DIAGNOSIS — J309 Allergic rhinitis, unspecified: Secondary | ICD-10-CM | POA: Diagnosis not present

## 2024-01-03 NOTE — Progress Notes (Signed)
 Immunotherapy   Patient Details  Name: Kelsey Craig MRN: [id number] Date of Birth: 25-Sep-1991  01/03/2024  Kelsey Craig here to pick up allergy vials G-[id number] and Molds-CR-DM. Patient received 0.1 ml out of both Red vials with an expiration of 12/04/2024. Patient lsw due to going to work, pt has epipen. Patient will continue to get allergy injections at work. Following schedule: C 0.1, 0.3, 0.5 then every 4 weeks  Frequency:1 time per week Epi-Pen:Epi-Pen Available  Consent signed and patient instructions given.   Dub Mikes 01/03/2024, 9:15 AM
